# Patient Record
Sex: Female | Born: 1935 | Race: White | Hispanic: No | Smoking: Never smoker
Health system: Southern US, Community
[De-identification: ages and names within clinical notes are randomized; demographics above are authoritative.]

## PROBLEM LIST (undated history)

## (undated) DIAGNOSIS — I10 Essential (primary) hypertension: Secondary | ICD-10-CM

## (undated) DIAGNOSIS — E785 Hyperlipidemia, unspecified: Secondary | ICD-10-CM

## (undated) HISTORY — PX: CORONARY ANGIOPLASTY WITH STENT PLACEMENT: SHX49

---

## 2019-07-19 ENCOUNTER — Other Ambulatory Visit: Payer: Self-pay

## 2019-07-19 DIAGNOSIS — Z20822 Contact with and (suspected) exposure to covid-19: Secondary | ICD-10-CM

## 2019-07-20 LAB — NOVEL CORONAVIRUS, NAA: SARS-CoV-2, NAA: NOT DETECTED

## 2019-07-21 ENCOUNTER — Telehealth: Payer: Self-pay

## 2019-07-21 NOTE — Telephone Encounter (Signed)
Patient and daughter called in and received covid test results

## 2019-07-23 ENCOUNTER — Emergency Department
Admission: EM | Admit: 2019-07-23 | Discharge: 2019-07-23 | Disposition: A | Discharge status: Home / Self Care | Payer: Medicare (Managed Care) | Attending: Emergency Medicine | Admitting: Emergency Medicine

## 2019-07-23 ENCOUNTER — Other Ambulatory Visit: Payer: Self-pay

## 2019-07-23 DIAGNOSIS — Z79899 Other long term (current) drug therapy: Secondary | ICD-10-CM | POA: Diagnosis not present

## 2019-07-23 DIAGNOSIS — I10 Essential (primary) hypertension: Secondary | ICD-10-CM | POA: Diagnosis not present

## 2019-07-23 DIAGNOSIS — R21 Rash and other nonspecific skin eruption: Secondary | ICD-10-CM | POA: Insufficient documentation

## 2019-07-23 DIAGNOSIS — Z7901 Long term (current) use of anticoagulants: Secondary | ICD-10-CM | POA: Diagnosis not present

## 2019-07-23 HISTORY — DX: Essential (primary) hypertension: I10

## 2019-07-23 HISTORY — DX: Hyperlipidemia, unspecified: E78.5

## 2019-07-23 MED ORDER — CEPHALEXIN 500 MG PO CAPS: 500.0000 mg | ORAL_CAPSULE | Freq: Two times a day (BID) | ORAL | 0 refills | Status: DC

## 2019-07-23 NOTE — ED Triage Notes (Signed)
Pt is here visiting from [place] with her daughter who states she noticed this area on the left temple that has been there and seems to have some drainage from it . Denies injury.

## 2019-07-23 NOTE — ED Notes (Signed)
See triage note  Daughter states she noticed a possible abscess area left temporal area  Unsure how long it has been there    But pt was complaining of pain and some min drainage noted

## 2019-07-23 NOTE — Discharge Instructions (Signed)
Call  [facility] and Dr. Kirkland Hun both of these are dermatologist that are in different offices.  Keep the area clean and dry.  Keflex 500 mg twice a day for the next 5 days.  The area is suspicious and should be looked at by a dermatologist.

## 2019-07-23 NOTE — ED Provider Notes (Signed)
[facility] Provider Note   ____________________________________________   First MD Initiated Contact with Patient 07/23/19 1224     (approximate)  I have reviewed the triage vital signs and the nursing notes.   HISTORY  Chief Complaint Abscess History also by daughter.  HPI Sara Conley is a 85 y.o. female presents to the ED with complaint of possible abscess in the left temporal area.  Daughter states that patient recently came to [place] from [place].  Daughter is unsure how long this area has been at the left temporal area.  Patient was complaining of pain with minimal drainage.  There is been no history of injury.  Negative for fever.  Daughter states that patient will be staying with her in [place].      Past Medical History:  Diagnosis Date  . Hyperlipidemia   . Hypertension     There are no active problems to display for this patient.   Past Surgical History:  Procedure Laterality Date  . CORONARY ANGIOPLASTY WITH STENT PLACEMENT      Prior to Admission medications   Medication Sig Start Date End Date Taking? Authorizing Provider  atorvastatin (LIPITOR) 40 MG tablet Take 40 mg by mouth daily.   Yes [provider]  clopidogrel (PLAVIX) 75 MG tablet Take 75 mg by mouth daily.   Yes [provider]  doxepin (SINEQUAN) 10 MG capsule Take 10 mg by mouth.   Yes [provider]  irbesartan (AVAPRO) 300 MG tablet Take 300 mg by mouth daily.   Yes [provider]  cephALEXin (KEFLEX) 500 MG capsule Take 1 capsule (500 mg total) by mouth 2 (two) times daily. 07/23/19   Johnn Hai, PA-C    Allergies Patient has no known allergies.  No family history on file.  Social History Social History   Tobacco Use  . Smoking status: Never Smoker  . Smokeless tobacco: Never Used  Substance Use Topics  . Alcohol use: Yes    Frequency: Never  . Drug use: Never     Review of Systems Constitutional: No fever/chills Eyes: No visual changes. ENT: No complaints. Cardiovascular: Denies chest pain. Respiratory: Denies shortness of breath. Musculoskeletal: Negative for back pain. Skin: Positive for skin eruption Neurological: Negative for headaches, focal weakness or numbness. ____________________________________________   PHYSICAL EXAM:  VITAL SIGNS: ED Triage Vitals  Enc Vitals Group     BP 07/23/19 1151 122/77     Pulse Rate 07/23/19 1151 63     Resp 07/23/19 1151 16     Temp 07/23/19 1151 98.8 F (37.1 C)     Temp Source 07/23/19 1151 Oral     SpO2 07/23/19 1151 97 %     Weight 07/23/19 1209 103 lb (46.7 kg)     Height 07/23/19 1209 4\' 10"  (1.473 m)     Head Circumference --      Peak Flow --      Pain Score 07/23/19 1209 6     Pain Loc --      Pain Edu? --      Excl. in [place]? --    Constitutional: Alert and oriented. Well appearing and in no acute distress. Eyes: Conjunctivae are normal. PERRL. EOMI. Head: Atraumatic. Neck: No stridor.   Cardiovascular: Normal rate, regular rhythm. Grossly normal heart sounds.  Good peripheral circulation. Respiratory: Normal respiratory effort.  No retractions. Lungs CTAB. Musculoskeletal: No lower extremity tenderness nor edema.  No joint effusions. Neurologic:  Normal speech and language. No gross focal neurologic deficits are appreciated. No gait instability. Skin:  Skin is warm, dry.  Left temporal area there is a single lesion that is discolored and raised.  The edges of this area has a pearly-like sheen and border is discrete.  The upper part of this lesion is hard and no drainage is noted.  This appears to have been there longer been daughter initially said. Psychiatric: Mood and affect are normal. Speech and behavior are normal.  ____________________________________________   LABS (all labs ordered are listed, but only abnormal results are displayed)  Labs Reviewed - No data to display   PROCEDURES  Procedure(s) performed (including Critical Care):  Procedures   ____________________________________________   INITIAL IMPRESSION / ASSESSMENT AND PLAN / ED COURSE  As part of my medical decision making, I reviewed the following data within the electronic MEDICAL RECORD NUMBER Notes from prior ED visits and [place] Controlled Substance Database  85 year old female is brought to the ED by daughter with an area to the left temporal that is uncertain how long it is been there.  On exam there is 2 areas of concern and a basal cell carcinoma needs to be ruled out.  Daughter was made aware that she would need to see a dermatologist and most likely have this biopsied to make a determination what type of lesion this is.  She was given the phone numbers and information about the 2 dermatologist listed in [place].  A prescription for Keflex 500 mg 1 twice a day was given to the patient as daughter feels that this area is infected. ____________________________________________   FINAL CLINICAL IMPRESSION(S) / ED DIAGNOSES  Final diagnoses:  Skin eruption     ED Discharge Orders         Ordered    cephALEXin (KEFLEX) 500 MG capsule  2 times daily     07/23/19 1317           Note:  This document was prepared using Dragon voice recognition software and may include unintentional dictation errors.    Tommi RumpsSummers, Rhonda L, PA-C 07/23/19 1533    Sharman CheekStafford, Phillip, MD 07/25/19 [phone]

## 2019-11-08 DIAGNOSIS — M79674 Pain in right toe(s): Secondary | ICD-10-CM | POA: Diagnosis not present

## 2019-11-08 DIAGNOSIS — H02036 Senile entropion of left eye, unspecified eyelid: Secondary | ICD-10-CM | POA: Diagnosis not present

## 2019-11-08 DIAGNOSIS — M79675 Pain in left toe(s): Secondary | ICD-10-CM | POA: Diagnosis not present

## 2019-11-08 DIAGNOSIS — B351 Tinea unguium: Secondary | ICD-10-CM | POA: Diagnosis not present

## 2019-11-08 DIAGNOSIS — M205X1 Other deformities of toe(s) (acquired), right foot: Secondary | ICD-10-CM | POA: Diagnosis not present

## 2019-11-08 DIAGNOSIS — M2042 Other hammer toe(s) (acquired), left foot: Secondary | ICD-10-CM | POA: Diagnosis not present

## 2019-11-11 DIAGNOSIS — I10 Essential (primary) hypertension: Secondary | ICD-10-CM | POA: Diagnosis not present

## 2019-11-11 DIAGNOSIS — G4709 Other insomnia: Secondary | ICD-10-CM | POA: Diagnosis present

## 2019-11-11 DIAGNOSIS — I2510 Atherosclerotic heart disease of native coronary artery without angina pectoris: Secondary | ICD-10-CM | POA: Diagnosis not present

## 2019-11-11 DIAGNOSIS — F5101 Primary insomnia: Secondary | ICD-10-CM | POA: Diagnosis not present

## 2019-11-11 DIAGNOSIS — E7800 Pure hypercholesterolemia, unspecified: Secondary | ICD-10-CM | POA: Diagnosis present

## 2019-11-12 DIAGNOSIS — I10 Essential (primary) hypertension: Secondary | ICD-10-CM | POA: Diagnosis not present

## 2019-11-12 DIAGNOSIS — E7800 Pure hypercholesterolemia, unspecified: Secondary | ICD-10-CM | POA: Diagnosis not present

## 2019-11-15 DIAGNOSIS — H02036 Senile entropion of left eye, unspecified eyelid: Secondary | ICD-10-CM | POA: Diagnosis not present

## 2019-11-22 DIAGNOSIS — I2510 Atherosclerotic heart disease of native coronary artery without angina pectoris: Secondary | ICD-10-CM | POA: Diagnosis not present

## 2019-11-22 DIAGNOSIS — E7800 Pure hypercholesterolemia, unspecified: Secondary | ICD-10-CM | POA: Diagnosis not present

## 2019-11-22 DIAGNOSIS — I38 Endocarditis, valve unspecified: Secondary | ICD-10-CM | POA: Diagnosis present

## 2019-11-22 DIAGNOSIS — I10 Essential (primary) hypertension: Secondary | ICD-10-CM | POA: Diagnosis not present

## 2019-11-25 DIAGNOSIS — H02035 Senile entropion of left lower eyelid: Secondary | ICD-10-CM | POA: Diagnosis not present

## 2019-11-25 DIAGNOSIS — H02036 Senile entropion of left eye, unspecified eyelid: Secondary | ICD-10-CM | POA: Diagnosis not present

## 2019-12-03 DIAGNOSIS — R208 Other disturbances of skin sensation: Secondary | ICD-10-CM | POA: Diagnosis not present

## 2019-12-03 DIAGNOSIS — H61031 Chondritis of right external ear: Secondary | ICD-10-CM | POA: Diagnosis not present

## 2019-12-09 DIAGNOSIS — R350 Frequency of micturition: Secondary | ICD-10-CM | POA: Diagnosis not present

## 2020-02-07 DIAGNOSIS — D0422 Carcinoma in situ of skin of left ear and external auricular canal: Secondary | ICD-10-CM | POA: Diagnosis not present

## 2020-02-07 DIAGNOSIS — L570 Actinic keratosis: Secondary | ICD-10-CM | POA: Diagnosis not present

## 2020-02-07 DIAGNOSIS — L905 Scar conditions and fibrosis of skin: Secondary | ICD-10-CM | POA: Diagnosis not present

## 2020-02-09 DIAGNOSIS — M2042 Other hammer toe(s) (acquired), left foot: Secondary | ICD-10-CM | POA: Diagnosis not present

## 2020-02-09 DIAGNOSIS — B351 Tinea unguium: Secondary | ICD-10-CM | POA: Diagnosis not present

## 2020-02-09 DIAGNOSIS — M205X1 Other deformities of toe(s) (acquired), right foot: Secondary | ICD-10-CM | POA: Diagnosis not present

## 2020-02-09 DIAGNOSIS — M79675 Pain in left toe(s): Secondary | ICD-10-CM | POA: Diagnosis not present

## 2020-02-09 DIAGNOSIS — M79674 Pain in right toe(s): Secondary | ICD-10-CM | POA: Diagnosis not present

## 2020-02-16 DIAGNOSIS — H353131 Nonexudative age-related macular degeneration, bilateral, early dry stage: Secondary | ICD-10-CM | POA: Diagnosis not present

## 2020-02-25 ENCOUNTER — Other Ambulatory Visit: Payer: Self-pay

## 2020-02-25 ENCOUNTER — Emergency Department: Payer: PPO

## 2020-02-25 ENCOUNTER — Emergency Department
Admission: EM | Admit: 2020-02-25 | Discharge: 2020-02-25 | Disposition: A | Discharge status: Home / Self Care | Payer: PPO | Attending: Emergency Medicine | Admitting: Emergency Medicine

## 2020-02-25 DIAGNOSIS — I10 Essential (primary) hypertension: Secondary | ICD-10-CM | POA: Diagnosis not present

## 2020-02-25 DIAGNOSIS — Z79899 Other long term (current) drug therapy: Secondary | ICD-10-CM | POA: Insufficient documentation

## 2020-02-25 DIAGNOSIS — Z7901 Long term (current) use of anticoagulants: Secondary | ICD-10-CM | POA: Diagnosis not present

## 2020-02-25 DIAGNOSIS — Y999 Unspecified external cause status: Secondary | ICD-10-CM | POA: Diagnosis not present

## 2020-02-25 DIAGNOSIS — Y9389 Activity, other specified: Secondary | ICD-10-CM | POA: Insufficient documentation

## 2020-02-25 DIAGNOSIS — W19XXXA Unspecified fall, initial encounter: Secondary | ICD-10-CM

## 2020-02-25 DIAGNOSIS — S0101XA Laceration without foreign body of scalp, initial encounter: Secondary | ICD-10-CM | POA: Diagnosis not present

## 2020-02-25 DIAGNOSIS — W07XXXA Fall from chair, initial encounter: Secondary | ICD-10-CM | POA: Insufficient documentation

## 2020-02-25 DIAGNOSIS — Y92002 Bathroom of unspecified non-institutional (private) residence single-family (private) house as the place of occurrence of the external cause: Secondary | ICD-10-CM | POA: Insufficient documentation

## 2020-02-25 MED ORDER — BACITRACIN-NEOMYCIN-POLYMYXIN 400-5-5000 EX OINT
TOPICAL_OINTMENT | Freq: Once | CUTANEOUS | Status: DC
  Filled 2020-02-25: qty 1

## 2020-02-25 MED ORDER — LIDOCAINE-EPINEPHRINE 2 %-1:100000 IJ SOLN
20.0000 mL | Freq: Once | INTRAMUSCULAR | Status: DC
  Filled 2020-02-25: qty 1

## 2020-02-25 NOTE — ED Provider Notes (Signed)
[facility] Emergency Department Provider Note   ____________________________________________   First MD Initiated Contact with Patient 02/25/20 1139     (approximate)  I have reviewed the triage vital signs and the nursing notes.   HISTORY  Chief Complaint Fall    HPI Sara Conley is a 86 y.o. female with possible history of hypertension and hyperlipidemia who presents to the ED complaining of fall.  History is limited due to language barrier, patient speaks in a specific [place] dialect.  Per daughter at bedside, patient was sitting on a chair in the bathroom when she fell forward and struck her head.  Daughter does not think that she lost consciousness as family heard her immediately call out.  She did suffer a laceration to her frontal scalp and is complaining of pain to this area, but otherwise denies any headache, neck pain, or extremity pain.  She does currently take Plavix.        Past Medical History:  Diagnosis Date  . Hyperlipidemia   . Hypertension     There are no problems to display for this patient.   Past Surgical History:  Procedure Laterality Date  . CORONARY ANGIOPLASTY WITH STENT PLACEMENT      Prior to Admission medications   Medication Sig Start Date End Date Taking? Authorizing Provider  atorvastatin (LIPITOR) 40 MG tablet Take 40 mg by mouth at bedtime.    Yes [provider]  clopidogrel (PLAVIX) 75 MG tablet Take 75 mg by mouth daily.   Yes [provider]  Doxepin HCl 6 MG TABS Take 6 mg by mouth at bedtime.   Yes [provider]  irbesartan (AVAPRO) 300 MG tablet Take 300 mg by mouth daily.   Yes [provider]  metoprolol succinate (TOPROL-XL) 50 MG 24 hr tablet Take 50 mg by mouth daily. 02/24/20  Yes [provider]    Allergies Patient has no known allergies.  No family history on file.  Social History Social History   Tobacco Use  . Smoking status: Never  Smoker  . Smokeless tobacco: Never Used  Substance Use Topics  . Alcohol use: Yes  . Drug use: Never    Review of Systems  Constitutional: No fever/chills Eyes: No visual changes. ENT: No sore throat. Cardiovascular: Denies chest pain. Respiratory: Denies shortness of breath. Gastrointestinal: No abdominal pain.  No nausea, no vomiting.  No diarrhea.  No constipation. Genitourinary: Negative for dysuria. Musculoskeletal: Negative for back pain. Skin: Negative for rash.  Positive for scalp laceration. Neurological: Negative for headaches, focal weakness or numbness.  ____________________________________________   PHYSICAL EXAM:  VITAL SIGNS: ED Triage Vitals  Enc Vitals Group     BP 02/25/20 0917 (!) 156/48     Pulse Rate 02/25/20 0917 74     Resp 02/25/20 0917 13     Temp 02/25/20 0917 97.8 F (36.6 C)     Temp Source 02/25/20 0917 Oral     SpO2 --      Weight 02/25/20 0919 104 lb (47.2 kg)     Height 02/25/20 0919 4\' 11"  (1.499 m)     Head Circumference --      Peak Flow --      Pain Score 02/25/20 0918 9     Pain Loc --      Pain Edu? --      Excl. in [place]? --     Constitutional: Alert and oriented. Eyes: Conjunctivae are normal. Head: Approximately 4 cm frontal  scalp laceration with minimal active bleeding. Nose: No congestion/rhinnorhea. Mouth/Throat: Mucous membranes are moist. Neck: Normal ROM, no midline cervical spine tenderness. Cardiovascular: Normal rate, regular rhythm. Grossly normal heart sounds. Respiratory: Normal respiratory effort.  No retractions. Lungs CTAB. Gastrointestinal: Soft and nontender. No distention. Genitourinary: deferred Musculoskeletal: No lower extremity tenderness nor edema. Neurologic:  Normal speech and language. No gross focal neurologic deficits are appreciated. Skin:  Skin is warm, dry and intact. No rash noted. Psychiatric: Mood and affect are normal. Speech and behavior are  normal.  ____________________________________________   LABS (all labs ordered are listed, but only abnormal results are displayed)  Labs Reviewed - No data to display   PROCEDURES  Procedure(s) performed (including Critical Care):  Marland KitchenMarland KitchenLaceration Repair  Date/Time: 02/25/2020 3:46 PM Performed by: Blake Divine, MD Authorized by: Blake Divine, MD   Consent:    Consent obtained:  Verbal   Consent given by:  Healthcare agent   Risks discussed:  Infection, pain, retained foreign body, poor cosmetic result and poor wound healing   Alternatives discussed:  No treatment Anesthesia (see MAR for exact dosages):    Anesthesia method:  Local infiltration   Local anesthetic:  Lidocaine 2% WITH epi Laceration details:    Location:  Scalp   Scalp location:  Frontal   Length (cm):  4 Repair type:    Repair type:  Simple Pre-procedure details:    Preparation:  Patient was prepped and draped in usual sterile fashion and imaging obtained to evaluate for foreign bodies Exploration:    Contaminated: no   Treatment:    Area cleansed with:  Saline   Amount of cleaning:  Standard   Irrigation solution:  Sterile saline   Irrigation method:  Pressure wash Skin repair:    Repair method:  Sutures   Suture size:  4-0   Suture material:  Nylon   Suture technique:  Simple interrupted   Number of sutures:  5 Approximation:    Approximation:  Loose Post-procedure details:    Dressing:  Antibiotic ointment and tube gauze   Patient tolerance of procedure:  Tolerated well, no immediate complications     ____________________________________________   INITIAL IMPRESSION / ASSESSMENT AND PLAN / ED COURSE       86 year old female presents to the ED following mechanical fall onto her head with no LOC, but with laceration to frontal scalp.  She is at her baseline mental status with no focal neurologic deficits, CT head is negative for acute process.  Per family, her tetanus is up-to-date.   Laceration to frontal scalp was repaired without difficulty, bacitracin and dressing were applied.  She is appropriate for discharge home and family advised to have stitches removed in 1 week, family agrees with plan.      ____________________________________________   FINAL CLINICAL IMPRESSION(S) / ED DIAGNOSES  Final diagnoses:  Fall, initial encounter  Laceration of scalp, initial encounter     ED Discharge Orders    None       Note:  This document was prepared using Dragon voice recognition software and may include unintentional dictation errors.   Blake Divine, MD 02/25/20 [phone]

## 2020-02-25 NOTE — ED Triage Notes (Signed)
Pt states she was in the bathroom changing her pad and tipped over hitting her fore head. Pt has a lac with controlled bleeding. Denies LOC. Pt is a/o at present. Pt does take blood thinners.

## 2020-03-03 DIAGNOSIS — Z4802 Encounter for removal of sutures: Secondary | ICD-10-CM | POA: Diagnosis not present

## 2020-03-03 DIAGNOSIS — W07XXXA Fall from chair, initial encounter: Secondary | ICD-10-CM | POA: Diagnosis not present

## 2020-03-03 DIAGNOSIS — S0181XA Laceration without foreign body of other part of head, initial encounter: Secondary | ICD-10-CM | POA: Diagnosis not present

## 2020-03-30 DIAGNOSIS — H903 Sensorineural hearing loss, bilateral: Secondary | ICD-10-CM | POA: Diagnosis not present

## 2020-03-30 DIAGNOSIS — J300 Vasomotor rhinitis: Secondary | ICD-10-CM | POA: Diagnosis not present

## 2020-03-30 DIAGNOSIS — H6123 Impacted cerumen, bilateral: Secondary | ICD-10-CM | POA: Diagnosis not present

## 2020-04-21 DIAGNOSIS — L308 Other specified dermatitis: Secondary | ICD-10-CM | POA: Diagnosis not present

## 2020-05-09 DIAGNOSIS — I10 Essential (primary) hypertension: Secondary | ICD-10-CM | POA: Diagnosis not present

## 2020-05-09 DIAGNOSIS — E7800 Pure hypercholesterolemia, unspecified: Secondary | ICD-10-CM | POA: Diagnosis not present

## 2020-05-09 DIAGNOSIS — I38 Endocarditis, valve unspecified: Secondary | ICD-10-CM | POA: Diagnosis not present

## 2020-05-09 DIAGNOSIS — I2510 Atherosclerotic heart disease of native coronary artery without angina pectoris: Secondary | ICD-10-CM | POA: Diagnosis not present

## 2020-05-25 DIAGNOSIS — M79674 Pain in right toe(s): Secondary | ICD-10-CM | POA: Diagnosis not present

## 2020-05-25 DIAGNOSIS — B351 Tinea unguium: Secondary | ICD-10-CM | POA: Diagnosis not present

## 2020-05-25 DIAGNOSIS — L97521 Non-pressure chronic ulcer of other part of left foot limited to breakdown of skin: Secondary | ICD-10-CM | POA: Diagnosis not present

## 2020-05-25 DIAGNOSIS — M79675 Pain in left toe(s): Secondary | ICD-10-CM | POA: Diagnosis not present

## 2020-05-25 DIAGNOSIS — M2042 Other hammer toe(s) (acquired), left foot: Secondary | ICD-10-CM | POA: Diagnosis not present

## 2020-05-31 DIAGNOSIS — F5101 Primary insomnia: Secondary | ICD-10-CM | POA: Diagnosis not present

## 2020-05-31 DIAGNOSIS — R350 Frequency of micturition: Secondary | ICD-10-CM | POA: Diagnosis not present

## 2020-06-08 DIAGNOSIS — H02036 Senile entropion of left eye, unspecified eyelid: Secondary | ICD-10-CM | POA: Diagnosis not present

## 2020-06-15 DIAGNOSIS — N3941 Urge incontinence: Secondary | ICD-10-CM | POA: Diagnosis not present

## 2020-06-15 DIAGNOSIS — R5381 Other malaise: Secondary | ICD-10-CM | POA: Diagnosis not present

## 2020-06-15 DIAGNOSIS — R531 Weakness: Secondary | ICD-10-CM | POA: Diagnosis not present

## 2020-06-15 DIAGNOSIS — G4709 Other insomnia: Secondary | ICD-10-CM | POA: Diagnosis not present

## 2020-06-26 DIAGNOSIS — N3941 Urge incontinence: Secondary | ICD-10-CM | POA: Diagnosis not present

## 2020-06-26 DIAGNOSIS — Z955 Presence of coronary angioplasty implant and graft: Secondary | ICD-10-CM | POA: Diagnosis not present

## 2020-06-26 DIAGNOSIS — R4181 Age-related cognitive decline: Secondary | ICD-10-CM | POA: Diagnosis not present

## 2020-06-26 DIAGNOSIS — D649 Anemia, unspecified: Secondary | ICD-10-CM | POA: Diagnosis not present

## 2020-06-26 DIAGNOSIS — G4709 Other insomnia: Secondary | ICD-10-CM | POA: Diagnosis not present

## 2020-06-26 DIAGNOSIS — Z9849 Cataract extraction status, unspecified eye: Secondary | ICD-10-CM | POA: Diagnosis not present

## 2020-06-26 DIAGNOSIS — E7800 Pure hypercholesterolemia, unspecified: Secondary | ICD-10-CM | POA: Diagnosis not present

## 2020-06-26 DIAGNOSIS — I2510 Atherosclerotic heart disease of native coronary artery without angina pectoris: Secondary | ICD-10-CM | POA: Diagnosis not present

## 2020-06-26 DIAGNOSIS — I38 Endocarditis, valve unspecified: Secondary | ICD-10-CM | POA: Diagnosis not present

## 2020-06-26 DIAGNOSIS — Z7901 Long term (current) use of anticoagulants: Secondary | ICD-10-CM | POA: Diagnosis not present

## 2020-06-26 DIAGNOSIS — M6281 Muscle weakness (generalized): Secondary | ICD-10-CM | POA: Diagnosis not present

## 2020-06-26 DIAGNOSIS — Z96649 Presence of unspecified artificial hip joint: Secondary | ICD-10-CM | POA: Diagnosis not present

## 2020-06-26 DIAGNOSIS — I10 Essential (primary) hypertension: Secondary | ICD-10-CM | POA: Diagnosis not present

## 2020-06-26 DIAGNOSIS — Z9181 History of falling: Secondary | ICD-10-CM | POA: Diagnosis not present

## 2020-06-30 DIAGNOSIS — H02036 Senile entropion of left eye, unspecified eyelid: Secondary | ICD-10-CM | POA: Diagnosis not present

## 2020-07-04 DIAGNOSIS — Z9849 Cataract extraction status, unspecified eye: Secondary | ICD-10-CM | POA: Diagnosis not present

## 2020-07-04 DIAGNOSIS — E7800 Pure hypercholesterolemia, unspecified: Secondary | ICD-10-CM | POA: Diagnosis not present

## 2020-07-04 DIAGNOSIS — R4181 Age-related cognitive decline: Secondary | ICD-10-CM | POA: Diagnosis not present

## 2020-07-04 DIAGNOSIS — Z96649 Presence of unspecified artificial hip joint: Secondary | ICD-10-CM | POA: Diagnosis not present

## 2020-07-04 DIAGNOSIS — Z9181 History of falling: Secondary | ICD-10-CM | POA: Diagnosis not present

## 2020-07-04 DIAGNOSIS — G4709 Other insomnia: Secondary | ICD-10-CM | POA: Diagnosis not present

## 2020-07-04 DIAGNOSIS — I10 Essential (primary) hypertension: Secondary | ICD-10-CM | POA: Diagnosis not present

## 2020-07-04 DIAGNOSIS — I2510 Atherosclerotic heart disease of native coronary artery without angina pectoris: Secondary | ICD-10-CM | POA: Diagnosis not present

## 2020-07-04 DIAGNOSIS — Z955 Presence of coronary angioplasty implant and graft: Secondary | ICD-10-CM | POA: Diagnosis not present

## 2020-07-04 DIAGNOSIS — D649 Anemia, unspecified: Secondary | ICD-10-CM | POA: Diagnosis not present

## 2020-07-04 DIAGNOSIS — M6281 Muscle weakness (generalized): Secondary | ICD-10-CM | POA: Diagnosis not present

## 2020-07-04 DIAGNOSIS — Z7901 Long term (current) use of anticoagulants: Secondary | ICD-10-CM | POA: Diagnosis not present

## 2020-07-04 DIAGNOSIS — N3941 Urge incontinence: Secondary | ICD-10-CM | POA: Diagnosis not present

## 2020-07-04 DIAGNOSIS — I38 Endocarditis, valve unspecified: Secondary | ICD-10-CM | POA: Diagnosis not present

## 2020-07-05 DIAGNOSIS — R682 Dry mouth, unspecified: Secondary | ICD-10-CM | POA: Diagnosis not present

## 2020-07-05 DIAGNOSIS — R1314 Dysphagia, pharyngoesophageal phase: Secondary | ICD-10-CM | POA: Diagnosis not present

## 2020-07-05 DIAGNOSIS — F458 Other somatoform disorders: Secondary | ICD-10-CM | POA: Diagnosis not present

## 2020-07-06 DIAGNOSIS — Z96649 Presence of unspecified artificial hip joint: Secondary | ICD-10-CM | POA: Diagnosis not present

## 2020-07-06 DIAGNOSIS — D649 Anemia, unspecified: Secondary | ICD-10-CM | POA: Diagnosis not present

## 2020-07-06 DIAGNOSIS — E7800 Pure hypercholesterolemia, unspecified: Secondary | ICD-10-CM | POA: Diagnosis not present

## 2020-07-06 DIAGNOSIS — I2510 Atherosclerotic heart disease of native coronary artery without angina pectoris: Secondary | ICD-10-CM | POA: Diagnosis not present

## 2020-07-06 DIAGNOSIS — Z9181 History of falling: Secondary | ICD-10-CM | POA: Diagnosis not present

## 2020-07-06 DIAGNOSIS — Z955 Presence of coronary angioplasty implant and graft: Secondary | ICD-10-CM | POA: Diagnosis not present

## 2020-07-06 DIAGNOSIS — I38 Endocarditis, valve unspecified: Secondary | ICD-10-CM | POA: Diagnosis not present

## 2020-07-06 DIAGNOSIS — I10 Essential (primary) hypertension: Secondary | ICD-10-CM | POA: Diagnosis not present

## 2020-07-06 DIAGNOSIS — Z9849 Cataract extraction status, unspecified eye: Secondary | ICD-10-CM | POA: Diagnosis not present

## 2020-07-06 DIAGNOSIS — M6281 Muscle weakness (generalized): Secondary | ICD-10-CM | POA: Diagnosis not present

## 2020-07-06 DIAGNOSIS — N3941 Urge incontinence: Secondary | ICD-10-CM | POA: Diagnosis not present

## 2020-07-06 DIAGNOSIS — Z7901 Long term (current) use of anticoagulants: Secondary | ICD-10-CM | POA: Diagnosis not present

## 2020-07-06 DIAGNOSIS — G4709 Other insomnia: Secondary | ICD-10-CM | POA: Diagnosis not present

## 2020-07-06 DIAGNOSIS — R4181 Age-related cognitive decline: Secondary | ICD-10-CM | POA: Diagnosis not present

## 2020-07-07 DIAGNOSIS — I2510 Atherosclerotic heart disease of native coronary artery without angina pectoris: Secondary | ICD-10-CM | POA: Diagnosis not present

## 2020-07-07 DIAGNOSIS — M6281 Muscle weakness (generalized): Secondary | ICD-10-CM | POA: Diagnosis not present

## 2020-07-07 DIAGNOSIS — I38 Endocarditis, valve unspecified: Secondary | ICD-10-CM | POA: Diagnosis not present

## 2020-07-07 DIAGNOSIS — N3941 Urge incontinence: Secondary | ICD-10-CM | POA: Diagnosis not present

## 2020-07-07 DIAGNOSIS — G4709 Other insomnia: Secondary | ICD-10-CM | POA: Diagnosis not present

## 2020-07-07 DIAGNOSIS — R4181 Age-related cognitive decline: Secondary | ICD-10-CM | POA: Diagnosis not present

## 2020-07-07 DIAGNOSIS — I10 Essential (primary) hypertension: Secondary | ICD-10-CM | POA: Diagnosis not present

## 2020-07-12 DIAGNOSIS — R4181 Age-related cognitive decline: Secondary | ICD-10-CM | POA: Diagnosis not present

## 2020-07-12 DIAGNOSIS — Z96649 Presence of unspecified artificial hip joint: Secondary | ICD-10-CM | POA: Diagnosis not present

## 2020-07-12 DIAGNOSIS — Z7901 Long term (current) use of anticoagulants: Secondary | ICD-10-CM | POA: Diagnosis not present

## 2020-07-12 DIAGNOSIS — I10 Essential (primary) hypertension: Secondary | ICD-10-CM | POA: Diagnosis not present

## 2020-07-12 DIAGNOSIS — I38 Endocarditis, valve unspecified: Secondary | ICD-10-CM | POA: Diagnosis not present

## 2020-07-12 DIAGNOSIS — E7800 Pure hypercholesterolemia, unspecified: Secondary | ICD-10-CM | POA: Diagnosis not present

## 2020-07-12 DIAGNOSIS — M6281 Muscle weakness (generalized): Secondary | ICD-10-CM | POA: Diagnosis not present

## 2020-07-12 DIAGNOSIS — Z9849 Cataract extraction status, unspecified eye: Secondary | ICD-10-CM | POA: Diagnosis not present

## 2020-07-12 DIAGNOSIS — N3941 Urge incontinence: Secondary | ICD-10-CM | POA: Diagnosis not present

## 2020-07-12 DIAGNOSIS — I2510 Atherosclerotic heart disease of native coronary artery without angina pectoris: Secondary | ICD-10-CM | POA: Diagnosis not present

## 2020-07-12 DIAGNOSIS — Z955 Presence of coronary angioplasty implant and graft: Secondary | ICD-10-CM | POA: Diagnosis not present

## 2020-07-12 DIAGNOSIS — Z9181 History of falling: Secondary | ICD-10-CM | POA: Diagnosis not present

## 2020-07-12 DIAGNOSIS — G4709 Other insomnia: Secondary | ICD-10-CM | POA: Diagnosis not present

## 2020-07-12 DIAGNOSIS — D649 Anemia, unspecified: Secondary | ICD-10-CM | POA: Diagnosis not present

## 2020-07-13 DIAGNOSIS — N3941 Urge incontinence: Secondary | ICD-10-CM | POA: Diagnosis not present

## 2020-07-13 DIAGNOSIS — I38 Endocarditis, valve unspecified: Secondary | ICD-10-CM | POA: Diagnosis not present

## 2020-07-13 DIAGNOSIS — G4709 Other insomnia: Secondary | ICD-10-CM | POA: Diagnosis not present

## 2020-07-13 DIAGNOSIS — I2510 Atherosclerotic heart disease of native coronary artery without angina pectoris: Secondary | ICD-10-CM | POA: Diagnosis not present

## 2020-07-13 DIAGNOSIS — Z7901 Long term (current) use of anticoagulants: Secondary | ICD-10-CM | POA: Diagnosis not present

## 2020-07-13 DIAGNOSIS — E7800 Pure hypercholesterolemia, unspecified: Secondary | ICD-10-CM | POA: Diagnosis not present

## 2020-07-13 DIAGNOSIS — M6281 Muscle weakness (generalized): Secondary | ICD-10-CM | POA: Diagnosis not present

## 2020-07-13 DIAGNOSIS — Z9181 History of falling: Secondary | ICD-10-CM | POA: Diagnosis not present

## 2020-07-13 DIAGNOSIS — Z9849 Cataract extraction status, unspecified eye: Secondary | ICD-10-CM | POA: Diagnosis not present

## 2020-07-13 DIAGNOSIS — Z96649 Presence of unspecified artificial hip joint: Secondary | ICD-10-CM | POA: Diagnosis not present

## 2020-07-13 DIAGNOSIS — I10 Essential (primary) hypertension: Secondary | ICD-10-CM | POA: Diagnosis not present

## 2020-07-13 DIAGNOSIS — Z955 Presence of coronary angioplasty implant and graft: Secondary | ICD-10-CM | POA: Diagnosis not present

## 2020-07-13 DIAGNOSIS — D649 Anemia, unspecified: Secondary | ICD-10-CM | POA: Diagnosis not present

## 2020-07-13 DIAGNOSIS — R4181 Age-related cognitive decline: Secondary | ICD-10-CM | POA: Diagnosis not present

## 2020-08-17 DIAGNOSIS — F419 Anxiety disorder, unspecified: Secondary | ICD-10-CM | POA: Diagnosis present

## 2020-08-17 DIAGNOSIS — F32A Depression, unspecified: Secondary | ICD-10-CM | POA: Diagnosis not present

## 2020-09-07 DIAGNOSIS — M79675 Pain in left toe(s): Secondary | ICD-10-CM | POA: Diagnosis not present

## 2020-09-07 DIAGNOSIS — M79674 Pain in right toe(s): Secondary | ICD-10-CM | POA: Diagnosis not present

## 2020-09-07 DIAGNOSIS — B351 Tinea unguium: Secondary | ICD-10-CM | POA: Diagnosis not present

## 2020-10-02 DIAGNOSIS — F419 Anxiety disorder, unspecified: Secondary | ICD-10-CM | POA: Diagnosis not present

## 2020-10-02 DIAGNOSIS — F32A Depression, unspecified: Secondary | ICD-10-CM | POA: Diagnosis not present

## 2020-10-04 DIAGNOSIS — R682 Dry mouth, unspecified: Secondary | ICD-10-CM | POA: Diagnosis not present

## 2020-10-04 DIAGNOSIS — R1314 Dysphagia, pharyngoesophageal phase: Secondary | ICD-10-CM | POA: Diagnosis not present

## 2020-10-09 ENCOUNTER — Other Ambulatory Visit: Payer: Self-pay | Admitting: Otolaryngology

## 2020-10-09 DIAGNOSIS — R1310 Dysphagia, unspecified: Secondary | ICD-10-CM

## 2020-10-09 DIAGNOSIS — Y844 Aspiration of fluid as the cause of abnormal reaction of the patient, or of later complication, without mention of misadventure at the time of the procedure: Secondary | ICD-10-CM

## 2020-10-20 ENCOUNTER — Ambulatory Visit
Admission: RE | Admit: 2020-10-20 | Discharge: 2020-10-20 | Disposition: A | Discharge status: Home / Self Care | Payer: PPO | Source: Ambulatory Visit | Attending: Otolaryngology | Admitting: Otolaryngology

## 2020-10-20 ENCOUNTER — Other Ambulatory Visit: Payer: Self-pay

## 2020-10-20 DIAGNOSIS — Y844 Aspiration of fluid as the cause of abnormal reaction of the patient, or of later complication, without mention of misadventure at the time of the procedure: Secondary | ICD-10-CM | POA: Diagnosis not present

## 2020-10-20 DIAGNOSIS — R059 Cough, unspecified: Secondary | ICD-10-CM | POA: Diagnosis not present

## 2020-10-20 DIAGNOSIS — R1310 Dysphagia, unspecified: Secondary | ICD-10-CM | POA: Diagnosis not present

## 2020-10-20 NOTE — Progress Notes (Signed)
Modified Barium Swallow Progress Note  Patient Details  Name: Sara Conley MRN: [id number] Date of Birth: 1918-09-11  Today's Date: 10/20/2020  Modified Barium Swallow completed.  Full report located under Chart Review in the Imaging Section.  Brief recommendations include the following:  Clinical Impression  Pt presents with grossly adequate oropharyngeal abilities when consuming thin liquids via cup, straw, nectar thick liquids via cup, puree, regular food textures and barium tablet whole with thin liquids. When viewing the imaging, pt's oral phase is mildly dis coordinated and her swallow initiation is at the level of vallecula and is occasionally at the level of the pyriform sinuses and she does have residue within her vallecula. However little research has been completed on how a person's oropharyngeal abilities age as they approach 86 years old. Pt didn't have any instances of coughing and no aspiration or penetration were observed in study. Education provided to pt's daughter with all questions answered to her satisfaction.   Swallow Evaluation Recommendations       SLP Diet Recommendations: Regular solids;Thin liquid   Liquid Administration via: Cup;Straw   Medication Administration: Whole meds with liquid   Supervision: Patient able to self feed   Compensations: Minimize environmental distractions;Slow rate;Small sips/bites   Postural Changes: Seated upright at 90 degrees   Oral Care Recommendations: Oral care BID      Rossanna Spitzley B. Dreama Saa M.S., CCC-SLP, [facility] Speech-Language Pathologist Rehabilitation Services Office [phone]   Shawntee Mainwaring 10/20/2020,4:40 PM

## 2020-10-25 ENCOUNTER — Ambulatory Visit: Payer: PPO

## 2020-12-11 ENCOUNTER — Emergency Department: Payer: PPO

## 2020-12-11 ENCOUNTER — Other Ambulatory Visit: Payer: Self-pay

## 2020-12-11 ENCOUNTER — Emergency Department
Admission: EM | Admit: 2020-12-11 | Discharge: 2020-12-11 | Disposition: A | Discharge status: Home / Self Care | Payer: PPO | Attending: Emergency Medicine | Admitting: Emergency Medicine

## 2020-12-11 DIAGNOSIS — S42021A Displaced fracture of shaft of right clavicle, initial encounter for closed fracture: Secondary | ICD-10-CM | POA: Diagnosis not present

## 2020-12-11 DIAGNOSIS — Z79899 Other long term (current) drug therapy: Secondary | ICD-10-CM | POA: Diagnosis not present

## 2020-12-11 DIAGNOSIS — W1830XA Fall on same level, unspecified, initial encounter: Secondary | ICD-10-CM | POA: Insufficient documentation

## 2020-12-11 DIAGNOSIS — S42001A Fracture of unspecified part of right clavicle, initial encounter for closed fracture: Secondary | ICD-10-CM | POA: Diagnosis not present

## 2020-12-11 DIAGNOSIS — I10 Essential (primary) hypertension: Secondary | ICD-10-CM | POA: Diagnosis not present

## 2020-12-11 DIAGNOSIS — M47812 Spondylosis without myelopathy or radiculopathy, cervical region: Secondary | ICD-10-CM | POA: Diagnosis not present

## 2020-12-11 DIAGNOSIS — S42101A Fracture of unspecified part of scapula, right shoulder, initial encounter for closed fracture: Secondary | ICD-10-CM | POA: Diagnosis not present

## 2020-12-11 DIAGNOSIS — M25511 Pain in right shoulder: Secondary | ICD-10-CM | POA: Diagnosis not present

## 2020-12-11 DIAGNOSIS — M19011 Primary osteoarthritis, right shoulder: Secondary | ICD-10-CM | POA: Diagnosis not present

## 2020-12-11 DIAGNOSIS — R519 Headache, unspecified: Secondary | ICD-10-CM | POA: Diagnosis not present

## 2020-12-11 DIAGNOSIS — W01198A Fall on same level from slipping, tripping and stumbling with subsequent striking against other object, initial encounter: Secondary | ICD-10-CM | POA: Diagnosis not present

## 2020-12-11 DIAGNOSIS — S42031A Displaced fracture of lateral end of right clavicle, initial encounter for closed fracture: Secondary | ICD-10-CM | POA: Diagnosis not present

## 2020-12-11 DIAGNOSIS — Z955 Presence of coronary angioplasty implant and graft: Secondary | ICD-10-CM | POA: Insufficient documentation

## 2020-12-11 DIAGNOSIS — S42017A Nondisplaced fracture of sternal end of right clavicle, initial encounter for closed fracture: Secondary | ICD-10-CM

## 2020-12-11 DIAGNOSIS — S42017S Nondisplaced fracture of sternal end of right clavicle, sequela: Secondary | ICD-10-CM | POA: Diagnosis not present

## 2020-12-11 DIAGNOSIS — Y9301 Activity, walking, marching and hiking: Secondary | ICD-10-CM | POA: Diagnosis not present

## 2020-12-11 DIAGNOSIS — R9082 White matter disease, unspecified: Secondary | ICD-10-CM | POA: Diagnosis not present

## 2020-12-11 DIAGNOSIS — G9389 Other specified disorders of brain: Secondary | ICD-10-CM | POA: Diagnosis not present

## 2020-12-11 DIAGNOSIS — M6258 Muscle wasting and atrophy, not elsewhere classified, other site: Secondary | ICD-10-CM | POA: Diagnosis not present

## 2020-12-11 DIAGNOSIS — S4991XA Unspecified injury of right shoulder and upper arm, initial encounter: Secondary | ICD-10-CM | POA: Diagnosis not present

## 2020-12-11 DIAGNOSIS — Z7902 Long term (current) use of antithrombotics/antiplatelets: Secondary | ICD-10-CM | POA: Insufficient documentation

## 2020-12-11 DIAGNOSIS — G319 Degenerative disease of nervous system, unspecified: Secondary | ICD-10-CM | POA: Diagnosis not present

## 2020-12-11 DIAGNOSIS — S0990XA Unspecified injury of head, initial encounter: Secondary | ICD-10-CM | POA: Diagnosis not present

## 2020-12-11 DIAGNOSIS — W19XXXA Unspecified fall, initial encounter: Secondary | ICD-10-CM

## 2020-12-11 DIAGNOSIS — R5381 Other malaise: Secondary | ICD-10-CM | POA: Diagnosis not present

## 2020-12-11 MED ORDER — ACETAMINOPHEN 500 MG PO TABS
1000.0000 mg | ORAL_TABLET | Freq: Once | ORAL | Status: AC
  Administered 2020-12-11: 1000 mg via ORAL
  Filled 2020-12-11: qty 2

## 2020-12-11 MED ORDER — OXYCODONE HCL 5 MG PO TABS: 2.5000 mg | ORAL_TABLET | Freq: Three times a day (TID) | ORAL | 0 refills | Status: DC | PRN

## 2020-12-11 MED ORDER — OXYCODONE HCL 5 MG PO TABS
2.5000 mg | ORAL_TABLET | Freq: Once | ORAL | Status: AC
  Administered 2020-12-11: 2.5 mg via ORAL
  Filled 2020-12-11: qty 1

## 2020-12-11 NOTE — ED Notes (Signed)
Pt to CT

## 2020-12-11 NOTE — ED Triage Notes (Signed)
Pt comes with family from [facility] with c/o confirmed dislocated right shoulder. Pt family states this happened this am and pt fell.  Pt was walking with walker and let go and fell backwards. No LOC or hitting head. Pt is on blood thinners.  Pt states 10/10 pain.

## 2020-12-11 NOTE — ED Notes (Signed)
Assisted patient to commode. Pt family instructed to pull red cord when ready to return to bed. Family verbalized understanding

## 2020-12-11 NOTE — ED Provider Notes (Signed)
[facility] Emergency Department Provider Note  ____________________________________________   Event Date/Time   First MD Initiated Contact with Patient 12/11/20 1701     (approximate)  I have reviewed the triage vital signs and the nursing notes.   HISTORY  Chief Complaint Dislocated Right Shoulder    HPI Sara Conley is a 87 y.o. female with history of hypertension, hyperlipidemia, on Plavix, here with right shoulder pain.  The patient had a mechanical fall this morning.  Patient reportedly often stops short of where she is going to set her walker to the side.  The patient did this this morning, and experienced a fall.  She landed onto her right shoulder.  She was then caught.  Did not directly hit her head.  She has had exquisite pain with any movement of her shoulder.  Pain is sharp and stabbing, worse with movement palpation.  No specific alleviating factors.  No other complaints.        Past Medical History:  Diagnosis Date  . Hyperlipidemia   . Hypertension     There are no problems to display for this patient.   Past Surgical History:  Procedure Laterality Date  . CORONARY ANGIOPLASTY WITH STENT PLACEMENT      Prior to Admission medications   Medication Sig Start Date End Date Taking? Authorizing Provider  oxyCODONE (ROXICODONE) 5 MG immediate release tablet Take 0.5 tablets (2.5 mg total) by mouth every 8 (eight) hours as needed for severe pain. 12/11/20 12/11/21 Yes Shaune Pollack, MD  atorvastatin (LIPITOR) 40 MG tablet Take 40 mg by mouth at bedtime.     [provider]  clopidogrel (PLAVIX) 75 MG tablet Take 75 mg by mouth daily.    [provider]  Doxepin HCl 6 MG TABS Take 6 mg by mouth at bedtime.    [provider]  irbesartan (AVAPRO) 300 MG tablet Take 300 mg by mouth daily.    [provider]  metoprolol succinate (TOPROL-XL) 50 MG 24 hr tablet Take 50 mg by mouth daily. 02/24/20    [provider]    Allergies Patient has no known allergies.  No family history on file.  Social History Social History   Tobacco Use  . Smoking status: Never Smoker  . Smokeless tobacco: Never Used  Substance Use Topics  . Alcohol use: Yes  . Drug use: Never    Review of Systems  Review of Systems  Constitutional: Negative for chills and fever.  HENT: Negative for sore throat.   Respiratory: Negative for shortness of breath.   Cardiovascular: Negative for chest pain.  Gastrointestinal: Negative for abdominal pain.  Genitourinary: Negative for flank pain.  Musculoskeletal: Positive for arthralgias and joint swelling. Negative for neck pain.  Skin: Negative for rash and wound.  Allergic/Immunologic: Negative for immunocompromised state.  Neurological: Negative for weakness and numbness.  Hematological: Does not bruise/bleed easily.  All other systems reviewed and are negative.    ____________________________________________  PHYSICAL EXAM:      VITAL SIGNS: ED Triage Vitals  Enc Vitals Group     BP 12/11/20 1519 (S) (!) 173/58     Pulse Rate 12/11/20 1519 65     Resp 12/11/20 1519 18     Temp 12/11/20 1519 97.9 F (36.6 C)     Temp src --      SpO2 12/11/20 1519 97 %     Weight 12/11/20 1517 84 lb (38.1 kg)     Height 12/11/20 1517 4'  10" (1.473 m)     Head Circumference --      Peak Flow --      Pain Score 12/11/20 1517 10     Pain Loc --      Pain Edu? --      Excl. in GC? --      Physical Exam Vitals and nursing note reviewed.  Constitutional:      General: She is not in acute distress.    Appearance: She is well-developed and well-nourished.  HENT:     Head: Normocephalic and atraumatic.  Eyes:     Conjunctiva/sclera: Conjunctivae normal.  Cardiovascular:     Rate and Rhythm: Normal rate and regular rhythm.     Heart sounds: Normal heart sounds.  Pulmonary:     Effort: Pulmonary effort is normal. No respiratory distress.     Breath  sounds: No wheezing.  Abdominal:     General: There is no distension.  Musculoskeletal:        General: No edema.     Cervical back: Neck supple.     Comments: Marked tenderness to palpation over the right medial anterior clavicle with large area of ecchymoses and contusion.  No open skin wounds.  Tenderness along the posterior right shoulder as well, though this mostly is referred to the clavicle.  Skin:    General: Skin is warm.     Capillary Refill: Capillary refill takes less than 2 seconds.     Findings: No rash.  Neurological:     Mental Status: She is alert and oriented to person, place, and time.     Motor: No abnormal muscle tone.     Comments: Strength 5 out of 5 bilateral upper extremities.  Normal sensation light touch.       ____________________________________________   LABS (all labs ordered are listed, but only abnormal results are displayed)  Labs Reviewed - No data to display  ____________________________________________  EKG: Normal sinus rhythm, ventricular rate 63.  PR 198, QRS 90, QTc 440.  No acute ST elevations or depressions. ________________________________________  RADIOLOGY All imaging, including plain films, CT scans, and ultrasounds, independently reviewed by me, and interpretations confirmed via formal radiology reads.  ED MD interpretation:   CT head: No acute intracranial abnormality. CT shoulder: Clavicle, scapular fracture, no significant displacement, humeral head is located  Official radiology report(s): CT Head Wo Contrast  Result Date: 12/11/2020 CLINICAL DATA:  Larey Seat.  Hit head. EXAM: CT HEAD WITHOUT CONTRAST TECHNIQUE: Contiguous axial images were obtained from the base of the skull through the vertex without intravenous contrast. COMPARISON:  02/25/2020 FINDINGS: Brain: Stable age related cerebral atrophy, ventriculomegaly and periventricular white matter disease. No extra-axial fluid collections are identified. No CT findings for acute  hemispheric infarction or intracranial hemorrhage. No mass lesions. The brainstem and cerebellum are normal. Vascular: Stable vascular calcifications. No aneurysm or hyperdense vessels. Skull: No acute skull fracture.  Lesions. Sinuses/Orbits: Paranasal sinuses and mastoid air cells are clear. The globes are intact. Other: No scalp lesions or scalp hematoma. IMPRESSION: 1. Stable age related cerebral atrophy, ventriculomegaly and periventricular white matter disease. 2. No acute intracranial findings or skull fracture. Electronically Signed   By: Rudie Meyer M.D.   On: 12/11/2020 18:56   CT Shoulder Right Wo Contrast  Result Date: 12/11/2020 CLINICAL DATA:  Larey Seat.  Injured right shoulder. EXAM: CT OF THE UPPER RIGHT EXTREMITY WITHOUT CONTRAST TECHNIQUE: Multidetector CT imaging of the upper right extremity was performed according to the standard protocol.  COMPARISON:  None. FINDINGS: There is a mildly displaced proximal clavicle fracture. The sternoclavicular joint is maintained. [facility] joint is also maintained. Mild degenerative changes for age. Advanced glenohumeral joint degenerative changes with full-thickness cartilage loss, joint space narrowing, osteophytic spurring and subchondral cystic change. No acute humeral head or neck fracture is identified. Nondisplaced fractures of the scapular spine are noted. The scapular body is intact. The visualized right ribs are intact. No definite rib fractures. The visualized right lung is grossly clear. No pneumothorax. The humeral head is riding high in the glenoid fossa with marked narrowing of the humeroacromial space consistent with a chronic rotator cuff tear. Associated marked fatty atrophy of the rotator cuff muscles. IMPRESSION: 1. Mildly displaced proximal clavicle fracture. 2. Advanced glenohumeral joint degenerative changes. 3. Nondisplaced fractures of the scapular spine. 4. Chronic rotator cuff tear with marked fatty atrophy of the rotator cuff muscles.  Electronically Signed   By: Rudie Meyer M.D.   On: 12/11/2020 19:01    ____________________________________________  PROCEDURES   Procedure(s) performed (including Critical Care):  Procedures  ____________________________________________  INITIAL IMPRESSION / MDM / ASSESSMENT AND PLAN / ED COURSE  As part of my medical decision making, I reviewed the following data within the electronic MEDICAL RECORD NUMBER Nursing notes reviewed and incorporated, Old chart reviewed, Notes from prior ED visits, and [place] Controlled Substance Database       *Irean Kendricks was evaluated in Emergency Department on 12/11/2020 for the symptoms described in the history of present illness. She was evaluated in the context of the global COVID-19 pandemic, which necessitated consideration that the patient might be at risk for infection with the SARS-CoV-2 virus that causes COVID-19. Institutional protocols and algorithms that pertain to the evaluation of patients at risk for COVID-19 are in a state of rapid change based on information released by regulatory bodies including the CDC and federal and state organizations. These policies and algorithms were followed during the patient's care in the ED.  Some ED evaluations and interventions may be delayed as a result of limited staffing during the pandemic.*     Medical Decision Making: 87 year old female here with right shoulder pain after mechanical fall.  She was in her usual state of health prior to the fall.  CT head is negative.  CT imaging shows mildly displaced proximal clavicle fracture, nondisplaced fracture of the scapular spine.  She is neurovascularly intact distally.  She has a small hematoma overlying with the clavicle but this is improved with ice and elevation.  Will discharge with brief course of analgesia as needed after risks and benefits were fully discussed with her daughter, who is her POA.  She was placed in a sling.  Refer her to orthopedics as an  outpatient.  ____________________________________________  FINAL CLINICAL IMPRESSION(S) / ED DIAGNOSES  Final diagnoses:  Closed fracture of right scapula, unspecified part of scapula, initial encounter  Closed nondisplaced fracture of sternal end of right clavicle, initial encounter  Fall, initial encounter     MEDICATIONS GIVEN DURING THIS VISIT:  Medications  acetaminophen (TYLENOL) tablet 1,000 mg (1,000 mg Oral Given 12/11/20 1900)  oxyCODONE (Oxy IR/ROXICODONE) immediate release tablet 2.5 mg (2.5 mg Oral Given 12/11/20 2005)     ED Discharge Orders         Ordered    oxyCODONE (ROXICODONE) 5 MG immediate release tablet  Every 8 hours PRN        12/11/20 1936  Note:  This document was prepared using Dragon voice recognition software and may include unintentional dictation errors.   Shaune Pollack, MD 12/11/20 2007

## 2020-12-11 NOTE — Discharge Instructions (Addendum)
Wear the sling at all times until follow-up with your primary or an orthopedist in the next 1 week.  You can apply ice to the area of bruising/swelling throughout the next 24 hours.  No heavy lifting or use of the arm until cleared.  Take Tylenol 500 to 1000 mg every 6 hours as needed for mild to moderate pain.  Do not take more than 4000 mg/day.  Take the prescribed pain medication for severe pain.  I would recommend taking this with a stool softener to prevent constipation.  This can be purchased over-the-counter such as Colace or docusate 100 mg daily.

## 2021-01-09 DIAGNOSIS — M2042 Other hammer toe(s) (acquired), left foot: Secondary | ICD-10-CM | POA: Diagnosis not present

## 2021-01-09 DIAGNOSIS — M79675 Pain in left toe(s): Secondary | ICD-10-CM | POA: Diagnosis not present

## 2021-01-09 DIAGNOSIS — M79674 Pain in right toe(s): Secondary | ICD-10-CM | POA: Diagnosis not present

## 2021-01-09 DIAGNOSIS — B351 Tinea unguium: Secondary | ICD-10-CM | POA: Diagnosis not present

## 2021-01-10 DIAGNOSIS — H353131 Nonexudative age-related macular degeneration, bilateral, early dry stage: Secondary | ICD-10-CM | POA: Diagnosis not present

## 2021-01-23 DIAGNOSIS — F419 Anxiety disorder, unspecified: Secondary | ICD-10-CM | POA: Diagnosis not present

## 2021-01-23 DIAGNOSIS — I2510 Atherosclerotic heart disease of native coronary artery without angina pectoris: Secondary | ICD-10-CM | POA: Diagnosis not present

## 2021-01-23 DIAGNOSIS — E7800 Pure hypercholesterolemia, unspecified: Secondary | ICD-10-CM | POA: Diagnosis not present

## 2021-01-23 DIAGNOSIS — I10 Essential (primary) hypertension: Secondary | ICD-10-CM | POA: Diagnosis not present

## 2021-01-23 DIAGNOSIS — I38 Endocarditis, valve unspecified: Secondary | ICD-10-CM | POA: Diagnosis not present

## 2021-01-23 DIAGNOSIS — Z862 Personal history of diseases of the blood and blood-forming organs and certain disorders involving the immune mechanism: Secondary | ICD-10-CM | POA: Diagnosis not present

## 2021-01-23 DIAGNOSIS — F32A Depression, unspecified: Secondary | ICD-10-CM | POA: Diagnosis not present

## 2021-01-30 DIAGNOSIS — F419 Anxiety disorder, unspecified: Secondary | ICD-10-CM | POA: Diagnosis not present

## 2021-01-30 DIAGNOSIS — Z862 Personal history of diseases of the blood and blood-forming organs and certain disorders involving the immune mechanism: Secondary | ICD-10-CM | POA: Diagnosis not present

## 2021-01-30 DIAGNOSIS — E7800 Pure hypercholesterolemia, unspecified: Secondary | ICD-10-CM | POA: Diagnosis not present

## 2021-01-30 DIAGNOSIS — F32A Depression, unspecified: Secondary | ICD-10-CM | POA: Diagnosis not present

## 2021-01-30 DIAGNOSIS — I10 Essential (primary) hypertension: Secondary | ICD-10-CM | POA: Diagnosis not present

## 2021-03-07 ENCOUNTER — Emergency Department: Payer: PPO

## 2021-03-07 ENCOUNTER — Other Ambulatory Visit: Payer: Self-pay

## 2021-03-07 ENCOUNTER — Inpatient Hospital Stay
Admission: EM | Admit: 2021-03-07 | Discharge: 2021-03-24 | DRG: 481 | Disposition: A | Discharge status: Hospice | Payer: PPO | Attending: Internal Medicine | Admitting: Internal Medicine

## 2021-03-07 DIAGNOSIS — Z79899 Other long term (current) drug therapy: Secondary | ICD-10-CM | POA: Diagnosis not present

## 2021-03-07 DIAGNOSIS — E7800 Pure hypercholesterolemia, unspecified: Secondary | ICD-10-CM | POA: Diagnosis present

## 2021-03-07 DIAGNOSIS — B9620 Unspecified Escherichia coli [E. coli] as the cause of diseases classified elsewhere: Secondary | ICD-10-CM | POA: Diagnosis not present

## 2021-03-07 DIAGNOSIS — N3000 Acute cystitis without hematuria: Secondary | ICD-10-CM

## 2021-03-07 DIAGNOSIS — T445X5A Adverse effect of predominantly beta-adrenoreceptor agonists, initial encounter: Secondary | ICD-10-CM | POA: Diagnosis not present

## 2021-03-07 DIAGNOSIS — S72009A Fracture of unspecified part of neck of unspecified femur, initial encounter for closed fracture: Secondary | ICD-10-CM | POA: Diagnosis present

## 2021-03-07 DIAGNOSIS — W19XXXA Unspecified fall, initial encounter: Secondary | ICD-10-CM

## 2021-03-07 DIAGNOSIS — E785 Hyperlipidemia, unspecified: Secondary | ICD-10-CM | POA: Diagnosis not present

## 2021-03-07 DIAGNOSIS — F0390 Unspecified dementia without behavioral disturbance: Secondary | ICD-10-CM | POA: Diagnosis present

## 2021-03-07 DIAGNOSIS — I959 Hypotension, unspecified: Secondary | ICD-10-CM | POA: Diagnosis not present

## 2021-03-07 DIAGNOSIS — D539 Nutritional anemia, unspecified: Secondary | ICD-10-CM | POA: Diagnosis not present

## 2021-03-07 DIAGNOSIS — R0902 Hypoxemia: Secondary | ICD-10-CM | POA: Diagnosis not present

## 2021-03-07 DIAGNOSIS — M25552 Pain in left hip: Secondary | ICD-10-CM | POA: Diagnosis not present

## 2021-03-07 DIAGNOSIS — M2550 Pain in unspecified joint: Secondary | ICD-10-CM | POA: Diagnosis not present

## 2021-03-07 DIAGNOSIS — Z955 Presence of coronary angioplasty implant and graft: Secondary | ICD-10-CM

## 2021-03-07 DIAGNOSIS — Z043 Encounter for examination and observation following other accident: Secondary | ICD-10-CM | POA: Diagnosis not present

## 2021-03-07 DIAGNOSIS — Z7902 Long term (current) use of antithrombotics/antiplatelets: Secondary | ICD-10-CM | POA: Diagnosis not present

## 2021-03-07 DIAGNOSIS — I2510 Atherosclerotic heart disease of native coronary artery without angina pectoris: Secondary | ICD-10-CM | POA: Diagnosis not present

## 2021-03-07 DIAGNOSIS — S72142A Displaced intertrochanteric fracture of left femur, initial encounter for closed fracture: Secondary | ICD-10-CM | POA: Diagnosis not present

## 2021-03-07 DIAGNOSIS — W010XXA Fall on same level from slipping, tripping and stumbling without subsequent striking against object, initial encounter: Secondary | ICD-10-CM | POA: Diagnosis present

## 2021-03-07 DIAGNOSIS — N390 Urinary tract infection, site not specified: Secondary | ICD-10-CM | POA: Diagnosis not present

## 2021-03-07 DIAGNOSIS — R54 Age-related physical debility: Secondary | ICD-10-CM | POA: Diagnosis not present

## 2021-03-07 DIAGNOSIS — I38 Endocarditis, valve unspecified: Secondary | ICD-10-CM | POA: Diagnosis not present

## 2021-03-07 DIAGNOSIS — F32A Depression, unspecified: Secondary | ICD-10-CM | POA: Diagnosis present

## 2021-03-07 DIAGNOSIS — Y92009 Unspecified place in unspecified non-institutional (private) residence as the place of occurrence of the external cause: Secondary | ICD-10-CM | POA: Diagnosis not present

## 2021-03-07 DIAGNOSIS — S72002A Fracture of unspecified part of neck of left femur, initial encounter for closed fracture: Secondary | ICD-10-CM | POA: Diagnosis not present

## 2021-03-07 DIAGNOSIS — L8990 Pressure ulcer of unspecified site, unspecified stage: Secondary | ICD-10-CM | POA: Insufficient documentation

## 2021-03-07 DIAGNOSIS — Z419 Encounter for procedure for purposes other than remedying health state, unspecified: Secondary | ICD-10-CM

## 2021-03-07 DIAGNOSIS — R404 Transient alteration of awareness: Secondary | ICD-10-CM | POA: Diagnosis not present

## 2021-03-07 DIAGNOSIS — D696 Thrombocytopenia, unspecified: Secondary | ICD-10-CM | POA: Diagnosis present

## 2021-03-07 DIAGNOSIS — I10 Essential (primary) hypertension: Secondary | ICD-10-CM | POA: Diagnosis present

## 2021-03-07 DIAGNOSIS — Z01818 Encounter for other preprocedural examination: Secondary | ICD-10-CM | POA: Diagnosis not present

## 2021-03-07 DIAGNOSIS — G4709 Other insomnia: Secondary | ICD-10-CM | POA: Diagnosis present

## 2021-03-07 DIAGNOSIS — R001 Bradycardia, unspecified: Secondary | ICD-10-CM | POA: Diagnosis not present

## 2021-03-07 DIAGNOSIS — F419 Anxiety disorder, unspecified: Secondary | ICD-10-CM | POA: Diagnosis present

## 2021-03-07 DIAGNOSIS — Z20822 Contact with and (suspected) exposure to covid-19: Secondary | ICD-10-CM | POA: Diagnosis not present

## 2021-03-07 DIAGNOSIS — Z66 Do not resuscitate: Secondary | ICD-10-CM | POA: Diagnosis not present

## 2021-03-07 DIAGNOSIS — E875 Hyperkalemia: Secondary | ICD-10-CM | POA: Diagnosis not present

## 2021-03-07 DIAGNOSIS — Y92230 Patient room in hospital as the place of occurrence of the external cause: Secondary | ICD-10-CM | POA: Diagnosis not present

## 2021-03-07 DIAGNOSIS — S0990XA Unspecified injury of head, initial encounter: Secondary | ICD-10-CM | POA: Diagnosis not present

## 2021-03-07 DIAGNOSIS — F0391 Unspecified dementia with behavioral disturbance: Secondary | ICD-10-CM | POA: Diagnosis not present

## 2021-03-07 DIAGNOSIS — Z7401 Bed confinement status: Secondary | ICD-10-CM | POA: Diagnosis not present

## 2021-03-07 LAB — URINALYSIS, COMPLETE (UACMP) WITH MICROSCOPIC
Bilirubin Urine: NEGATIVE
Glucose, UA: NEGATIVE mg/dL
Hgb urine dipstick: NEGATIVE
Ketones, ur: NEGATIVE mg/dL
Nitrite: POSITIVE — AB
Protein, ur: 30 mg/dL — AB
Specific Gravity, Urine: 1.02 (ref 1.005–1.030)
pH: 5 (ref 5.0–8.0)

## 2021-03-07 LAB — CBC WITH DIFFERENTIAL/PLATELET
Abs Immature Granulocytes: 0.08 10*3/uL — ABNORMAL HIGH (ref 0.00–0.07)
Basophils Absolute: 0.1 10*3/uL (ref 0.0–0.1)
Basophils Relative: 1 %
Eosinophils Absolute: 0.2 10*3/uL (ref 0.0–0.5)
Eosinophils Relative: 2 %
HCT: 32.6 % — ABNORMAL LOW (ref 36.0–46.0)
Hemoglobin: 10.7 g/dL — ABNORMAL LOW (ref 12.0–15.0)
Immature Granulocytes: 1 %
Lymphocytes Relative: 11 %
Lymphs Abs: 1.2 10*3/uL (ref 0.7–4.0)
MCH: 31.9 pg (ref 26.0–34.0)
MCHC: 32.8 g/dL (ref 30.0–36.0)
MCV: 97.3 fL (ref 80.0–100.0)
Monocytes Absolute: 1 10*3/uL (ref 0.1–1.0)
Monocytes Relative: 9 %
Neutro Abs: 8.6 10*3/uL — ABNORMAL HIGH (ref 1.7–7.7)
Neutrophils Relative %: 76 %
Platelets: 189 10*3/uL (ref 150–400)
RBC: 3.35 MIL/uL — ABNORMAL LOW (ref 3.87–5.11)
RDW: 15.4 % (ref 11.5–15.5)
WBC: 11.1 10*3/uL — ABNORMAL HIGH (ref 4.0–10.5)
nRBC: 0 % (ref 0.0–0.2)

## 2021-03-07 LAB — COMPREHENSIVE METABOLIC PANEL
ALT: 11 U/L (ref 0–44)
AST: 23 U/L (ref 15–41)
Albumin: 3.5 g/dL (ref 3.5–5.0)
Alkaline Phosphatase: 51 U/L (ref 38–126)
Anion gap: 9 (ref 5–15)
BUN: 34 mg/dL — ABNORMAL HIGH (ref 8–23)
CO2: 23 mmol/L (ref 22–32)
Calcium: 8.7 mg/dL — ABNORMAL LOW (ref 8.9–10.3)
Chloride: 103 mmol/L (ref 98–111)
Creatinine, Ser: 0.82 mg/dL (ref 0.44–1.00)
GFR, Estimated: >60 mL/min (ref >60)
Glucose, Bld: 115 mg/dL — ABNORMAL HIGH (ref 70–99)
Potassium: 4.5 mmol/L (ref 3.5–5.1)
Sodium: 135 mmol/L (ref 135–145)
Total Bilirubin: 0.5 mg/dL (ref 0.3–1.2)
Total Protein: 6.1 g/dL — ABNORMAL LOW (ref 6.5–8.1)

## 2021-03-07 LAB — TYPE AND SCREEN
ABO/RH(D): O POS
Antibody Screen: NEGATIVE

## 2021-03-07 LAB — RESP PANEL BY RT-PCR (FLU A&B, COVID) ARPGX2
Influenza A by PCR: NEGATIVE
Influenza B by PCR: NEGATIVE
SARS Coronavirus 2 by RT PCR: NEGATIVE

## 2021-03-07 MED ORDER — LACTATED RINGERS IV SOLN: INTRAVENOUS | Status: DC

## 2021-03-07 MED ORDER — ACETAMINOPHEN 325 MG PO TABS
650.0000 mg | ORAL_TABLET | Freq: Four times a day (QID) | ORAL | Status: DC | PRN
  Administered 2021-03-15 – 2021-03-23 (×10): 650 mg via ORAL
  Filled 2021-03-07 (×11): qty 2

## 2021-03-07 MED ORDER — DOXEPIN HCL 10 MG PO CAPS
10.0000 mg | ORAL_CAPSULE | Freq: Every day | ORAL | Status: DC
  Administered 2021-03-07 – 2021-03-23 (×15): 10 mg via ORAL
  Filled 2021-03-07 (×18): qty 1

## 2021-03-07 MED ORDER — SODIUM CHLORIDE 0.9 % IV SOLN
1.0000 g | Freq: Once | INTRAVENOUS | Status: AC
  Administered 2021-03-07: 1 g via INTRAVENOUS
  Filled 2021-03-07: qty 10

## 2021-03-07 MED ORDER — IRBESARTAN 150 MG PO TABS
300.0000 mg | ORAL_TABLET | Freq: Every day | ORAL | Status: DC
  Filled 2021-03-07 (×4): qty 2

## 2021-03-07 MED ORDER — ONDANSETRON HCL 4 MG/2ML IJ SOLN: 4.0000 mg | Freq: Four times a day (QID) | INTRAMUSCULAR | Status: DC | PRN

## 2021-03-07 MED ORDER — ATORVASTATIN CALCIUM 20 MG PO TABS
40.0000 mg | ORAL_TABLET | Freq: Every day | ORAL | Status: DC
  Administered 2021-03-09 – 2021-03-23 (×14): 40 mg via ORAL
  Filled 2021-03-07 (×16): qty 2

## 2021-03-07 MED ORDER — KETOROLAC TROMETHAMINE 15 MG/ML IJ SOLN
15.0000 mg | Freq: Four times a day (QID) | INTRAMUSCULAR | Status: AC | PRN
  Administered 2021-03-07 – 2021-03-12 (×2): 15 mg via INTRAVENOUS
  Filled 2021-03-07 (×3): qty 1

## 2021-03-07 MED ORDER — FENTANYL CITRATE (PF) 100 MCG/2ML IJ SOLN
25.0000 ug | Freq: Once | INTRAMUSCULAR | Status: AC
  Administered 2021-03-07: 25 ug via INTRAVENOUS
  Filled 2021-03-07: qty 2

## 2021-03-07 MED ORDER — ACETAMINOPHEN 650 MG RE SUPP: 650.0000 mg | Freq: Four times a day (QID) | RECTAL | Status: DC | PRN

## 2021-03-07 MED ORDER — SODIUM CHLORIDE 0.9 % IV SOLN
1.0000 g | INTRAVENOUS | Status: DC
  Administered 2021-03-08 – 2021-03-11 (×4): 1 g via INTRAVENOUS
  Filled 2021-03-07: qty 10
  Filled 2021-03-07: qty 1
  Filled 2021-03-07: qty 10
  Filled 2021-03-07 (×2): qty 1

## 2021-03-07 MED ORDER — ONDANSETRON HCL 4 MG PO TABS: 4.0000 mg | ORAL_TABLET | Freq: Four times a day (QID) | ORAL | Status: DC | PRN

## 2021-03-07 MED ORDER — ENOXAPARIN SODIUM 40 MG/0.4ML IJ SOSY
40.0000 mg | PREFILLED_SYRINGE | INTRAMUSCULAR | Status: DC
  Administered 2021-03-07: 40 mg via SUBCUTANEOUS
  Filled 2021-03-07: qty 0.4

## 2021-03-07 MED ORDER — METOPROLOL SUCCINATE ER 50 MG PO TB24
50.0000 mg | ORAL_TABLET | Freq: Every day | ORAL | Status: DC
  Administered 2021-03-09: 50 mg via ORAL
  Filled 2021-03-07 (×5): qty 1

## 2021-03-07 MED ORDER — ONDANSETRON HCL 4 MG/2ML IJ SOLN
4.0000 mg | Freq: Once | INTRAMUSCULAR | Status: AC
  Administered 2021-03-07: 4 mg via INTRAVENOUS
  Filled 2021-03-07: qty 2

## 2021-03-07 MED ORDER — CLONIDINE HCL 0.1 MG PO TABS: 0.1000 mg | ORAL_TABLET | Freq: Four times a day (QID) | ORAL | Status: DC | PRN

## 2021-03-07 NOTE — ED Triage Notes (Signed)
See first nurse note- pt fell while walking today. No LOC. Complaints of left femur pain. Skin tear present to L upper arm. Pt with daughter

## 2021-03-07 NOTE — ED Provider Notes (Signed)
[facility] Emergency Department Provider Note  ____________________________________________   Event Date/Time   First MD Initiated Contact with Patient 03/07/21 1727     (approximate)  I have reviewed the triage vital signs and the nursing notes.   HISTORY  Chief Complaint Fall    HPI Sara Conley is a [age] y.o. female with history of hypertension hyperlipidemia here with hip pain.  The patient reportedly had a mechanical fall today.  History provided primarily by her daughter.  Patient has some mild dementia and also speaks [place].  Patient was in her usual state of health today.  She has not been recently ill.  She had a mechanical fall and tripped while using her walker in the restroom.  Fell directly onto her left hip.  Sustained immediate onset of what she describes as severe pain.  No alleviating factors.  No distal numbness or weakness.  No head injury.  She has not        Past Medical History:  Diagnosis Date  . Hyperlipidemia   . Hypertension     Patient Active Problem List   Diagnosis Date Noted  . left Hip fracture (HCC) 03/07/2021  . Acute lower UTI 03/07/2021  . Anxiety and depression 08/17/2020  . Valvular heart disease 11/22/2019  . Coronary artery disease involving native coronary artery of native heart without angina pectoris 11/11/2019  . Essential hypertension 11/11/2019  . Other insomnia 11/11/2019  . Pure hypercholesterolemia 11/11/2019    Past Surgical History:  Procedure Laterality Date  . CORONARY ANGIOPLASTY WITH STENT PLACEMENT      Prior to Admission medications   Medication Sig Start Date End Date Taking? Authorizing Provider  atorvastatin (LIPITOR) 40 MG tablet Take 40 mg by mouth at bedtime.    Yes [provider]  cholecalciferol (VITAMIN D3) 25 MCG (1000 UNIT) tablet Take 1,000 Units by mouth daily.   Yes [provider]  clopidogrel (PLAVIX) 75 MG tablet Take 75 mg by mouth daily.    Yes [provider]  escitalopram (LEXAPRO) 10 MG tablet Take 10 mg by mouth at bedtime. 01/03/21  Yes [provider]  irbesartan (AVAPRO) 300 MG tablet Take 300 mg by mouth daily.   Yes [provider]  melatonin 3 MG TABS tablet Take 3 mg by mouth at bedtime.   Yes [provider]  metoprolol succinate (TOPROL-XL) 50 MG 24 hr tablet Take 50 mg by mouth daily. 02/24/20  Yes [provider]  oxyCODONE (ROXICODONE) 5 MG immediate release tablet Take 0.5 tablets (2.5 mg total) by mouth every 8 (eight) hours as needed for severe pain. Patient not taking: No sig reported 12/11/20 12/11/21  Shaune PollackIsaacs, Lakiesha Ralphs, MD    Allergies Patient has no known allergies.  No family history on file.  Social History Social History   Tobacco Use  . Smoking status: Never Smoker  . Smokeless tobacco: Never Used  Substance Use Topics  . Alcohol use: Yes  . Drug use: Never    Review of Systems  Review of Systems  Constitutional: Negative for chills and fever.  HENT: Negative for sore throat.   Respiratory: Negative for shortness of breath.   Cardiovascular: Negative for chest pain.  Gastrointestinal: Negative for abdominal pain.  Genitourinary: Negative for flank pain.  Musculoskeletal: Positive for arthralgias and gait problem. Negative for neck pain.  Skin: Negative for rash and wound.  Allergic/Immunologic: Negative for immunocompromised state.  Neurological: Negative for weakness and numbness.  Hematological: Does not bruise/bleed  easily.     ____________________________________________  PHYSICAL EXAM:      VITAL SIGNS: ED Triage Vitals [03/07/21 1717]  Enc Vitals Group     BP (!) 207/66     Pulse Rate (!) 59     Resp 16     Temp 98.6 F (37 C)     Temp Source Oral     SpO2 95 %     Weight 94 lb (42.6 kg)     Height 4\' 10"  (1.473 m)     Head Circumference      Peak Flow      Pain Score 10     Pain Loc      Pain Edu?      Excl. in GC?       Physical Exam Vitals and nursing note reviewed.  Constitutional:      General: She is not in acute distress.    Appearance: She is well-developed.  HENT:     Head: Normocephalic and atraumatic.  Eyes:     Conjunctiva/sclera: Conjunctivae normal.  Cardiovascular:     Rate and Rhythm: Normal rate and regular rhythm.     Heart sounds: Normal heart sounds.     Comments: 2+ DP/PT pulses Pulmonary:     Effort: Pulmonary effort is normal. No respiratory distress.     Breath sounds: No wheezing.  Abdominal:     General: There is no distension.  Musculoskeletal:     Cervical back: Neck supple.     Comments: Significant TTP over left lateral hip/greater troch, with slight shortening and external rotation.   Skin:    General: Skin is warm.     Capillary Refill: Capillary refill takes less than 2 seconds.     Findings: No rash.  Neurological:     Mental Status: She is alert and oriented to person, place, and time.     Motor: No abnormal muscle tone.       ____________________________________________   LABS (all labs ordered are listed, but only abnormal results are displayed)  Labs Reviewed  CBC WITH DIFFERENTIAL/PLATELET - Abnormal; Notable for the following components:      Result Value   WBC 11.1 (*)    RBC 3.35 (*)    Hemoglobin 10.7 (*)    HCT 32.6 (*)    Neutro Abs 8.6 (*)    Abs Immature Granulocytes 0.08 (*)    All other components within normal limits  URINALYSIS, COMPLETE (UACMP) WITH MICROSCOPIC - Abnormal; Notable for the following components:   Color, Urine YELLOW (*)    APPearance HAZY (*)    Protein, ur 30 (*)    Nitrite POSITIVE (*)    Leukocytes,Ua TRACE (*)    Bacteria, UA RARE (*)    All other components within normal limits  COMPREHENSIVE METABOLIC PANEL - Abnormal; Notable for the following components:   Glucose, Bld 115 (*)    BUN 34 (*)    Calcium 8.7 (*)    Total Protein 6.1 (*)    All other components within normal limits  RESP PANEL  BY RT-PCR (FLU A&B, COVID) [id number]  URINE CULTURE  COMPREHENSIVE METABOLIC PANEL  TYPE AND SCREEN    ____________________________________________  EKG: sinus bradycardia, VR 58. PR 202, QRS 90, QTc 433. No acute ST elevation or depression. No ischemia or infarct. ________________________________________  RADIOLOGY All imaging, including plain films, CT scans, and ultrasounds, independently reviewed by me, and interpretations confirmed via formal radiology reads.  ED MD interpretation:  CT Head; NAICA CXR: Clear XR Hip: Comminuted IT fx  Official radiology report(s): CT Head Wo Contrast  Result Date: 03/07/2021 CLINICAL DATA:  Larey Seat EXAM: CT HEAD WITHOUT CONTRAST TECHNIQUE: Contiguous axial images were obtained from the base of the skull through the vertex without intravenous contrast. COMPARISON:  12/11/2020 FINDINGS: Brain: Scattered hypodensities throughout the periventricular and subcortical white matter are consistent with chronic small vessel ischemic changes, stable. No acute infarct or hemorrhage. Lateral ventricles and midline structures are stable. No acute extra-axial fluid collections. No mass effect. Vascular: No hyperdense vessel or unexpected calcification. Skull: Normal. Negative for fracture or focal lesion. Sinuses/Orbits: No acute finding. Other: None. IMPRESSION: 1. Stable exam, no acute intracranial process. Electronically Signed   By: Sharlet Salina M.D.   On: 03/07/2021 18:39   DG Chest Portable 1 View  Result Date: 03/07/2021 CLINICAL DATA:  Tripped and fell, left hip pain EXAM: PORTABLE CHEST 1 VIEW COMPARISON:  None. FINDINGS: Single frontal view of the chest demonstrates an unremarkable cardiac silhouette. Atherosclerosis of the aortic arch. No airspace disease, effusion, or pneumothorax. No acute displaced fractures. The bones are diffusely osteopenic. IMPRESSION: 1. No acute intrathoracic process. Electronically Signed   By: Sharlet Salina M.D.   On: 03/07/2021  18:53   DG HIP UNILAT WITH PELVIS 2-3 VIEWS LEFT  Result Date: 03/07/2021 CLINICAL DATA:  Tripped and fell, left hip pain EXAM: DG HIP (WITH OR WITHOUT PELVIS) 2-3V LEFT COMPARISON:  None. FINDINGS: Frontal view of the pelvis as well as frontal and cross-table lateral views of the left hip are obtained. There is a comminuted intertrochanteric left hip fracture with mild impaction and varus angulation at the fracture site. No dislocation. The remainder of the bony pelvis is unremarkable. Right hip arthroplasty is identified without complication. IMPRESSION: 1. Comminuted intertrochanteric left hip fracture as above. Electronically Signed   By: Sharlet Salina M.D.   On: 03/07/2021 18:54    ____________________________________________  PROCEDURES   Procedure(s) performed (including Critical Care):  .1-3 Lead EKG Interpretation Performed by: Shaune Pollack, MD Authorized by: Shaune Pollack, MD     Interpretation: normal     ECG rate:  50-60   ECG rate assessment: normal     Rhythm: sinus bradycardia     Ectopy: none     Conduction: normal   Comments:     Indication: receiving iv analgesia    ____________________________________________  INITIAL IMPRESSION / MDM / ASSESSMENT AND PLAN / ED COURSE  As part of my medical decision making, I reviewed the following data within the electronic MEDICAL RECORD NUMBER Nursing notes reviewed and incorporated, Old chart reviewed, Notes from prior ED visits, and [place] Controlled Substance Database       *Gratia Disla was evaluated in Emergency Department on 03/07/2021 for the symptoms described in the history of present illness. She was evaluated in the context of the global COVID-19 pandemic, which necessitated consideration that the patient might be at risk for infection with the SARS-CoV-2 virus that causes COVID-19. Institutional protocols and algorithms that pertain to the evaluation of patients at risk for COVID-19 are in a state of rapid change  based on information released by regulatory bodies including the CDC and federal and state organizations. These policies and algorithms were followed during the patient's care in the ED.  Some ED evaluations and interventions may be delayed as a result of limited staffing during the pandemic.*     Medical Decision Making:  87 yo F here with left hip pain  after mechanical fall. Imaging shows left IT hip fx. Ct head reviewed and is negative. CXR clear. Labs show mild leukocytosis. UA is concerning for UTI. No fever or signs of sepsis/pyelo. Will start on abx, admit. Dr. Odis Luster consulted with orthopedics. Pt is on Plavix, will require wash out period. Family updated and in agreement.  ____________________________________________  FINAL CLINICAL IMPRESSION(S) / ED DIAGNOSES  Final diagnoses:  Closed displaced intertrochanteric fracture of left femur, initial encounter (HCC)  Acute cystitis without hematuria     MEDICATIONS GIVEN DURING THIS VISIT:  Medications  enoxaparin (LOVENOX) injection 40 mg (has no administration in time range)  lactated ringers infusion (has no administration in time range)  acetaminophen (TYLENOL) tablet 650 mg (has no administration in time range)    Or  acetaminophen (TYLENOL) suppository 650 mg (has no administration in time range)  ketorolac (TORADOL) 15 MG/ML injection 15 mg (has no administration in time range)  ondansetron (ZOFRAN) tablet 4 mg (has no administration in time range)    Or  ondansetron (ZOFRAN) injection 4 mg (has no administration in time range)  cefTRIAXone (ROCEPHIN) 1 g in sodium chloride 0.9 % 100 mL IVPB (has no administration in time range)  cloNIDine (CATAPRES) tablet 0.1 mg (has no administration in time range)  atorvastatin (LIPITOR) tablet 40 mg (has no administration in time range)  Doxepin HCl TABS 6 mg (has no administration in time range)  irbesartan (AVAPRO) tablet 300 mg (has no administration in time range)  metoprolol  succinate (TOPROL-XL) 24 hr tablet 50 mg (has no administration in time range)  fentaNYL (SUBLIMAZE) injection 25 mcg (25 mcg Intravenous Given 03/07/21 1849)  ondansetron (ZOFRAN) injection 4 mg (4 mg Intravenous Given 03/07/21 1850)  cefTRIAXone (ROCEPHIN) 1 g in sodium chloride 0.9 % 100 mL IVPB (0 g Intravenous Stopped 03/07/21 2117)     ED Discharge Orders    None       Note:  This document was prepared using Dragon voice recognition software and may include unintentional dictation errors.   Shaune Pollack, MD 03/07/21 2237

## 2021-03-07 NOTE — H&P (Signed)
History and Physical   Sara Conley HCW:[id number] DOB: 1918-04-22 DOA: 03/07/2021  Referring MD/NP/PA: Dr. Erma Heritage  PCP: Marisue Ivan, MD   Outpatient Specialists: None  Patient coming from: Home  Chief Complaint: Fall with left hip pain  HPI: Sara Conley is a 87 y.o. female with medical history significant of hypertension, hyperlipidemia, dementia, who only speaks [place] brought in by her daughter after sustaining a mechanical fall at home and having pain on her left side.  She tripped low while using her walker in the restroom.  She fell on her left side.  There was severe pain in the area and she called out to her daughter.  Patient was seen in the ER and has left intertrochanteric fracture.  Orthopedics consulted but patient is on Plavix.  Recommendation is for admission and getting patient ready for surgery probably in about 3 days.  She did not hit her head.  Patient is frail but has been apparently doing well in her usual state of health.  Able to do her ADLs according to the daughter.  She was using the walker but can move around the house.  She otherwise has no other complaints.  No other areas of injury..  ED Course: Temperature is 98.6 initial blood pressure 207/66 with pulse 59 respirate of 16 oxygen sat 87% on room air.  Currently 100%.  White count is 11.1 hemoglobin 10.7 platelets of 189.  Chemistry mostly within normal except BUN 35 calcium 8.7.  Glucose of 115.  Viral screen including COVID-19 is negative urinalysis showed WBC 16 with rare bacteria and positive nitrite.  Head CT without contrast showed no acute findings.  Chest x-ray showed no significant findings.  X-ray of the left hip showed comminuted intertrochanteric left hip fracture.  Patient being admitted to the hospital for further evaluation and treatment.  Review of Systems: As per HPI otherwise 10 point review of systems negative.    Past Medical History:  Diagnosis Date  . Hyperlipidemia   .  Hypertension     Past Surgical History:  Procedure Laterality Date  . CORONARY ANGIOPLASTY WITH STENT PLACEMENT       reports that she has never smoked. She has never used smokeless tobacco. She reports current alcohol use. She reports that she does not use drugs.  No Known Allergies  No family history on file.   Prior to Admission medications   Medication Sig Start Date End Date Taking? Authorizing Provider  atorvastatin (LIPITOR) 40 MG tablet Take 40 mg by mouth at bedtime.     [provider]  clopidogrel (PLAVIX) 75 MG tablet Take 75 mg by mouth daily.    [provider]  Doxepin HCl 6 MG TABS Take 6 mg by mouth at bedtime.    [provider]  irbesartan (AVAPRO) 300 MG tablet Take 300 mg by mouth daily.    [provider]  metoprolol succinate (TOPROL-XL) 50 MG 24 hr tablet Take 50 mg by mouth daily. 02/24/20   [provider]  oxyCODONE (ROXICODONE) 5 MG immediate release tablet Take 0.5 tablets (2.5 mg total) by mouth every 8 (eight) hours as needed for severe pain. 12/11/20 12/11/21  Shaune Pollack, MD    Physical Exam: Vitals:   03/07/21 1717 03/07/21 1934  BP: (!) 207/66   Pulse: (!) 59   Resp: 16   Temp: 98.6 F (37 C)   TempSrc: Oral   SpO2: 95% 98%  Weight: 42.6 kg   Height: 4\' 10"  (1.473 m)  Constitutional: Frail, chronically ill looking: In mild distress due to pain Vitals:   03/07/21 1717 03/07/21 1934  BP: (!) 207/66   Pulse: (!) 59   Resp: 16   Temp: 98.6 F (37 C)   TempSrc: Oral   SpO2: 95% 98%  Weight: 42.6 kg   Height: 4\' 10"  (1.473 m)    Eyes: PERRL, lids and conjunctivae normal ENMT: Mucous membranes are dry. Posterior pharynx clear of any exudate or lesions.Normal dentition.  Neck: normal, supple, no masses, no thyromegaly Respiratory: clear to auscultation bilaterally, no wheezing, no crackles. Normal respiratory effort. No accessory muscle use.  Cardiovascular: Sinus bradycardia, no  murmurs / rubs / gallops. No extremity edema. 2+ pedal pulses. No carotid bruits.  Abdomen: no tenderness, no masses palpated. No hepatosplenomegaly. Bowel sounds positive.  Musculoskeletal: no clubbing / cyanosis.  Left lower extremity laterally rotated and shortened no contractures. Normal muscle tone.  Skin: no rashes, lesions, ulcers. No induration Neurologic: CN 2-12 grossly intact. Sensation intact, DTR normal. Strength 5/5 in all 4.  Psychiatric: Normal judgment and insight. Alert and oriented x 3. Normal mood.     Labs on Admission: I have personally reviewed following labs and imaging studies  CBC: Recent Labs  Lab 03/07/21 1859  WBC 11.1*  NEUTROABS 8.6*  HGB 10.7*  HCT 32.6*  MCV 97.3  PLT 189   Basic Metabolic Panel: No results for input(s): NA, K, CL, CO2, GLUCOSE, BUN, CREATININE, CALCIUM, MG, PHOS in the last 168 hours. GFR: CrCl cannot be calculated (No successful lab value found.). Liver Function Tests: No results for input(s): AST, ALT, ALKPHOS, BILITOT, PROT, ALBUMIN in the last 168 hours. No results for input(s): LIPASE, AMYLASE in the last 168 hours. No results for input(s): AMMONIA in the last 168 hours. Coagulation Profile: No results for input(s): INR, PROTIME in the last 168 hours. Cardiac Enzymes: No results for input(s): CKTOTAL, CKMB, CKMBINDEX, TROPONINI in the last 168 hours. BNP (last 3 results) No results for input(s): PROBNP in the last 8760 hours. HbA1C: No results for input(s): HGBA1C in the last 72 hours. CBG: No results for input(s): GLUCAP in the last 168 hours. Lipid Profile: No results for input(s): CHOL, HDL, LDLCALC, TRIG, CHOLHDL, LDLDIRECT in the last 72 hours. Thyroid Function Tests: No results for input(s): TSH, T4TOTAL, FREET4, T3FREE, THYROIDAB in the last 72 hours. Anemia Panel: No results for input(s): VITAMINB12, FOLATE, FERRITIN, TIBC, IRON, RETICCTPCT in the last 72 hours. Urine analysis:    Component Value Date/Time    COLORURINE YELLOW (A) 03/07/2021 1859   APPEARANCEUR HAZY (A) 03/07/2021 1859   LABSPEC 1.020 03/07/2021 1859   PHURINE 5.0 03/07/2021 1859   GLUCOSEU NEGATIVE 03/07/2021 1859   HGBUR NEGATIVE 03/07/2021 1859   BILIRUBINUR NEGATIVE 03/07/2021 1859   KETONESUR NEGATIVE 03/07/2021 1859   PROTEINUR 30 (A) 03/07/2021 1859   NITRITE POSITIVE (A) 03/07/2021 1859   LEUKOCYTESUR TRACE (A) 03/07/2021 1859   Sepsis Labs: @LABRCNTIP (procalcitonin:4,lacticidven:4) ) Recent Results (from the past 240 hour(s))  Resp Panel by RT-PCR (Flu A&B, Covid) Nasopharyngeal Swab     Status: None   Collection Time: 03/07/21  6:59 PM   Specimen: Nasopharyngeal Swab; Nasopharyngeal(NP) swabs in vial transport medium  Result Value Ref Range Status   SARS Coronavirus 2 by RT PCR NEGATIVE NEGATIVE Final    Comment: (NOTE) SARS-CoV-2 target nucleic acids are NOT DETECTED.  The SARS-CoV-2 RNA is generally detectable in upper respiratory specimens during the acute phase of infection. The lowest concentration of SARS-CoV-2  viral copies this assay can detect is 138 copies/mL. A negative result does not preclude SARS-Cov-2 infection and should not be used as the sole basis for treatment or other patient management decisions. A negative result may occur with  improper specimen collection/handling, submission of specimen other than nasopharyngeal swab, presence of viral mutation(s) within the areas targeted by this assay, and inadequate number of viral copies(<138 copies/mL). A negative result must be combined with clinical observations, patient history, and epidemiological information. The expected result is Negative.  Fact Sheet for Patients:  BloggerCourse.com  Fact Sheet for Healthcare Providers:  SeriousBroker.it  This test is no t yet approved or cleared by the [place] FDA and  has been authorized for detection and/or diagnosis of SARS-CoV-2  by FDA under an Emergency Use Authorization (EUA). This EUA will remain  in effect (meaning this test can be used) for the duration of the COVID-19 declaration under Section 564(b)(1) of the Act, 21 U.S.C.section 360bbb-3(b)(1), unless the authorization is terminated  or revoked sooner.       Influenza A by PCR NEGATIVE NEGATIVE Final   Influenza B by PCR NEGATIVE NEGATIVE Final    Comment: (NOTE) The Xpert Xpress SARS-CoV-2/FLU/RSV plus assay is intended as an aid in the diagnosis of influenza from Nasopharyngeal swab specimens and should not be used as a sole basis for treatment. Nasal washings and aspirates are unacceptable for Xpert Xpress SARS-CoV-2/FLU/RSV testing.  Fact Sheet for Patients: BloggerCourse.com  Fact Sheet for Healthcare Providers: SeriousBroker.it  This test is not yet approved or cleared by the [place] FDA and has been authorized for detection and/or diagnosis of SARS-CoV-2 by FDA under an Emergency Use Authorization (EUA). This EUA will remain in effect (meaning this test can be used) for the duration of the COVID-19 declaration under Section 564(b)(1) of the Act, 21 U.S.C. section 360bbb-3(b)(1), unless the authorization is terminated or revoked.  Performed at [facility], [address] [place], [place] [address]      Radiological Exams on Admission: CT Head Wo Contrast  Result Date: 03/07/2021 CLINICAL DATA:  Larey Seat EXAM: CT HEAD WITHOUT CONTRAST TECHNIQUE: Contiguous axial images were obtained from the base of the skull through the vertex without intravenous contrast. COMPARISON:  12/11/2020 FINDINGS: Brain: Scattered hypodensities throughout the periventricular and subcortical white matter are consistent with chronic small vessel ischemic changes, stable. No acute infarct or hemorrhage. Lateral ventricles and midline structures are stable. No acute extra-axial fluid collections. No mass  effect. Vascular: No hyperdense vessel or unexpected calcification. Skull: Normal. Negative for fracture or focal lesion. Sinuses/Orbits: No acute finding. Other: None. IMPRESSION: 1. Stable exam, no acute intracranial process. Electronically Signed   By: Sharlet Salina M.D.   On: 03/07/2021 18:39   DG Chest Portable 1 View  Result Date: 03/07/2021 CLINICAL DATA:  Tripped and fell, left hip pain EXAM: PORTABLE CHEST 1 VIEW COMPARISON:  None. FINDINGS: Single frontal view of the chest demonstrates an unremarkable cardiac silhouette. Atherosclerosis of the aortic arch. No airspace disease, effusion, or pneumothorax. No acute displaced fractures. The bones are diffusely osteopenic. IMPRESSION: 1. No acute intrathoracic process. Electronically Signed   By: Sharlet Salina M.D.   On: 03/07/2021 18:53   DG HIP UNILAT WITH PELVIS 2-3 VIEWS LEFT  Result Date: 03/07/2021 CLINICAL DATA:  Tripped and fell, left hip pain EXAM: DG HIP (WITH OR WITHOUT PELVIS) 2-3V LEFT COMPARISON:  None. FINDINGS: Frontal view of the pelvis as well as frontal and cross-table lateral views of the  left hip are obtained. There is a comminuted intertrochanteric left hip fracture with mild impaction and varus angulation at the fracture site. No dislocation. The remainder of the bony pelvis is unremarkable. Right hip arthroplasty is identified without complication. IMPRESSION: 1. Comminuted intertrochanteric left hip fracture as above. Electronically Signed   By: Sharlet SalinaMichael  Brown M.D.   On: 03/07/2021 18:54    EKG: Independently reviewed.  Shows sinus bradycardia with a rate of 56 with no significant ST changes.  Assessment/Plan Principal Problem:   left Hip fracture (HCC) Active Problems:   Anxiety and depression   Coronary artery disease involving native coronary artery of native heart without angina pectoris   Essential hypertension   Other insomnia   Pure hypercholesterolemia   Valvular heart disease   Acute lower UTI     #1  left intertrochanteric fracture: Status post mechanical fall.  Patient to be seen by orthopedics.  Due to Plavix patient will not have immediate surgery.  We will hold the Plavix.  Initiate DVT prophylaxis with Lovenox which can be discontinued 12 hours prior to surgery.  Pain control and supportive care.  #2 coronary artery disease: Has been stable inactive.  Holding Plavix otherwise continue other medications.  #3 essential hypertension: Blood pressure is acutely elevated.  Initiate home regimen and add clonidine as needed  #4 hyperlipidemia: Continue with statin.  #5 UTI: Empirically start Rocephin.  Sent urine and blood cultures.  #6 anxiety with depression: Continue monitoring patient.   DVT prophylaxis: Lovenox Code Status: Full code Family Communication: Daughter Disposition Plan: To be determined Consults called: Orthopedic surgery Dr. Odis LusterBowers Admission status: Inpatient  Severity of Illness: The appropriate patient status for this patient is INPATIENT. Inpatient status is judged to be reasonable and necessary in order to provide the required intensity of service to ensure the patient's safety. The patient's presenting symptoms, physical exam findings, and initial radiographic and laboratory data in the context of their chronic comorbidities is felt to place them at high risk for further clinical deterioration. Furthermore, it is not anticipated that the patient will be medically stable for discharge from the hospital within 2 midnights of admission. The following factors support the patient status of inpatient.   " The patient's presenting symptoms include fall with left hip pain. " The worrisome physical exam findings include laterally rotated left hip. " The initial radiographic and laboratory data are worrisome because of left intertrochanteric fracture. " The chronic co-morbidities include hypertension.   * I certify that at the point of admission it is my clinical judgment  that the patient will require inpatient hospital care spanning beyond 2 midnights from the point of admission due to high intensity of service, high risk for further deterioration and high frequency of surveillance required.Lonia Blood*    Christella App,LAWAL MD [facility] Pager [phone]  If 7PM-7AM, please contact night-coverage [email or web address] Password [email or web address]  03/07/2021, 8:28 PM

## 2021-03-07 NOTE — ED Triage Notes (Addendum)
First Nurse Note: Arrives via [facility].  Patient fell at home today.  While walking back from the bedroom, tripped over carpet.  No LOC. C/O left femur pain.  Unable to stand since fall.  VS wnl.  20 g LFA.  50 mcg fentanyl given PTA

## 2021-03-08 DIAGNOSIS — N390 Urinary tract infection, site not specified: Secondary | ICD-10-CM | POA: Diagnosis not present

## 2021-03-08 DIAGNOSIS — F0390 Unspecified dementia without behavioral disturbance: Secondary | ICD-10-CM | POA: Diagnosis not present

## 2021-03-08 DIAGNOSIS — S72002A Fracture of unspecified part of neck of left femur, initial encounter for closed fracture: Secondary | ICD-10-CM | POA: Diagnosis not present

## 2021-03-08 LAB — COMPREHENSIVE METABOLIC PANEL
ALT: 10 U/L (ref 0–44)
AST: 18 U/L (ref 15–41)
Albumin: 3.1 g/dL — ABNORMAL LOW (ref 3.5–5.0)
Alkaline Phosphatase: 48 U/L (ref 38–126)
Anion gap: 5 (ref 5–15)
BUN: 37 mg/dL — ABNORMAL HIGH (ref 8–23)
CO2: 28 mmol/L (ref 22–32)
Calcium: 8.5 mg/dL — ABNORMAL LOW (ref 8.9–10.3)
Chloride: 103 mmol/L (ref 98–111)
Creatinine, Ser: 1.06 mg/dL — ABNORMAL HIGH (ref 0.44–1.00)
GFR, Estimated: 47 mL/min — ABNORMAL LOW (ref >60)
Glucose, Bld: 99 mg/dL (ref 70–99)
Potassium: 4.9 mmol/L (ref 3.5–5.1)
Sodium: 136 mmol/L (ref 135–145)
Total Bilirubin: 0.6 mg/dL (ref 0.3–1.2)
Total Protein: 5.7 g/dL — ABNORMAL LOW (ref 6.5–8.1)

## 2021-03-08 MED ORDER — HYDROCODONE-ACETAMINOPHEN 5-325 MG PO TABS
1.0000 | ORAL_TABLET | Freq: Four times a day (QID) | ORAL | Status: DC | PRN
  Administered 2021-03-08 – 2021-03-15 (×4): 1 via ORAL
  Filled 2021-03-08 (×4): qty 1

## 2021-03-08 MED ORDER — CHLORHEXIDINE GLUCONATE CLOTH 2 % EX PADS
6.0000 | MEDICATED_PAD | Freq: Every day | CUTANEOUS | Status: DC
  Administered 2021-03-08 – 2021-03-18 (×7): 6 via TOPICAL

## 2021-03-08 MED ORDER — MORPHINE SULFATE (PF) 2 MG/ML IV SOLN
1.0000 mg | INTRAVENOUS | Status: DC | PRN
  Administered 2021-03-09 – 2021-03-11 (×4): 1 mg via INTRAVENOUS
  Filled 2021-03-08 (×5): qty 1

## 2021-03-08 MED ORDER — LORAZEPAM 2 MG/ML IJ SOLN
0.5000 mg | Freq: Once | INTRAMUSCULAR | Status: AC
  Administered 2021-03-08: 0.5 mg via INTRAVENOUS
  Filled 2021-03-08: qty 1

## 2021-03-08 MED ORDER — ENOXAPARIN SODIUM 30 MG/0.3ML IJ SOSY
30.0000 mg | PREFILLED_SYRINGE | INTRAMUSCULAR | Status: AC
  Administered 2021-03-08 – 2021-03-10 (×2): 30 mg via SUBCUTANEOUS
  Filled 2021-03-08 (×2): qty 0.3

## 2021-03-08 NOTE — Progress Notes (Signed)
PROGRESS NOTE    Sara Conley  QIO:[id number] DOB: Sep 27, 1918 DOA: 03/07/2021 PCP: Marisue Ivan, MD   Assessment & Plan:   Principal Problem:   left Hip fracture (HCC) Active Problems:   Anxiety and depression   Coronary artery disease involving native coronary artery of native heart without angina pectoris   Essential hypertension   Other insomnia   Pure hypercholesterolemia   Valvular heart disease   Acute lower UTI   Left intertrochanteric fracture: s/p mechanical fall. Continue to hold home dose of plavix. Norco, morphine prn for pain. Ortho surg consulted   HTN: continue on irbesartan, metoprolol   HLD: continue on statin  Likely UTI: UA is positive. Urine cx is pending. Continue on IV ceftriaxone    Hx of CAD: continue on home dose of metoprolol, irbesartan, statin. Continue to hold plavix   Dementia: continue w/ supportive care   DVT prophylaxis: lovenox  Code Status: full  Family Communication: discussed pt's care w/ pt's family at bedside and answered their questions  Disposition Plan: depends on PT/OT recs   Level of care: Med-Surg   Status is: Inpatient  Remains inpatient appropriate because:Ongoing diagnostic testing needed not appropriate for outpatient work up, Unsafe d/c plan, IV treatments appropriate due to intensity of illness or inability to take PO and Inpatient level of care appropriate due to severity of illness   Dispo: The patient is from: Home              Anticipated d/c is to: SNF              Patient currently is not medically stable to d/c.   Difficult to place patient: unclear     Consultants:   Ortho surg  Procedures:    Antimicrobials:   Subjective: Pt denies any hip pain currently   Objective: Vitals:   03/07/21 2030 03/07/21 2236 03/08/21 0337 03/08/21 0815  BP: (!) 175/70 (!) 184/46 (!) 111/37 134/64  Pulse: (!) 53 (!) 53 (!) 54 (!) 58  Resp:  17 16 18   Temp:  98.3 F (36.8 C) 97.7 F (36.5 C) 97.8 F  (36.6 C)  TempSrc:      SpO2: 100% 100% 100% 94%  Weight:      Height:        Intake/Output Summary (Last 24 hours) at 03/08/2021 1045 Last data filed at 03/08/2021 0900 Gross per 24 hour  Intake 503.23 ml  Output 200 ml  Net 303.23 ml   Filed Weights   03/07/21 1717  Weight: 42.6 kg    Examination:  General exam: Appears comfortable. Frail appearing  Respiratory system: Clear to auscultation. Respiratory effort normal. Cardiovascular system: S1 & S2 +. No rubs, gallops or clicks.  Gastrointestinal system: Abdomen is nondistended, soft and nontender. Normal bowel sounds heard. Central nervous system: Alert and awake. Moves all extremities  Psychiatry: Judgement and insight appear abnormal. Flat mood and affect    Data Reviewed: I have personally reviewed following labs and imaging studies  CBC: Recent Labs  Lab 03/07/21 1859  WBC 11.1*  NEUTROABS 8.6*  HGB 10.7*  HCT 32.6*  MCV 97.3  PLT 189   Basic Metabolic Panel: Recent Labs  Lab 03/07/21 2005 03/08/21 0614  NA 135 136  K 4.5 4.9  CL 103 103  CO2 23 28  GLUCOSE 115* 99  BUN 34* 37*  CREATININE 0.82 1.06*  CALCIUM 8.7* 8.5*   GFR: Estimated Creatinine Clearance: 18.2 mL/min (A) (by C-G formula based on SCr  of 1.06 mg/dL (H)). Liver Function Tests: Recent Labs  Lab 03/07/21 2005 03/08/21 0614  AST 23 18  ALT 11 10  ALKPHOS 51 48  BILITOT 0.5 0.6  PROT 6.1* 5.7*  ALBUMIN 3.5 3.1*   No results for input(s): LIPASE, AMYLASE in the last 168 hours. No results for input(s): AMMONIA in the last 168 hours. Coagulation Profile: No results for input(s): INR, PROTIME in the last 168 hours. Cardiac Enzymes: No results for input(s): CKTOTAL, CKMB, CKMBINDEX, TROPONINI in the last 168 hours. BNP (last 3 results) No results for input(s): PROBNP in the last 8760 hours. HbA1C: No results for input(s): HGBA1C in the last 72 hours. CBG: No results for input(s): GLUCAP in the last 168 hours. Lipid  Profile: No results for input(s): CHOL, HDL, LDLCALC, TRIG, CHOLHDL, LDLDIRECT in the last 72 hours. Thyroid Function Tests: No results for input(s): TSH, T4TOTAL, FREET4, T3FREE, THYROIDAB in the last 72 hours. Anemia Panel: No results for input(s): VITAMINB12, FOLATE, FERRITIN, TIBC, IRON, RETICCTPCT in the last 72 hours. Sepsis Labs: No results for input(s): PROCALCITON, LATICACIDVEN in the last 168 hours.  Recent Results (from the past 240 hour(s))  Resp Panel by RT-PCR (Flu A&B, Covid) Nasopharyngeal Swab     Status: None   Collection Time: 03/07/21  6:59 PM   Specimen: Nasopharyngeal Swab; Nasopharyngeal(NP) swabs in vial transport medium  Result Value Ref Range Status   SARS Coronavirus 2 by RT PCR NEGATIVE NEGATIVE Final    Comment: (NOTE) SARS-CoV-2 target nucleic acids are NOT DETECTED.  The SARS-CoV-2 RNA is generally detectable in upper respiratory specimens during the acute phase of infection. The lowest concentration of SARS-CoV-2 viral copies this assay can detect is 138 copies/mL. A negative result does not preclude SARS-Cov-2 infection and should not be used as the sole basis for treatment or other patient management decisions. A negative result may occur with  improper specimen collection/handling, submission of specimen other than nasopharyngeal swab, presence of viral mutation(s) within the areas targeted by this assay, and inadequate number of viral copies(<138 copies/mL). A negative result must be combined with clinical observations, patient history, and epidemiological information. The expected result is Negative.  Fact Sheet for Patients:  BloggerCourse.com  Fact Sheet for Healthcare Providers:  SeriousBroker.it  This test is no t yet approved or cleared by the [place] FDA and  has been authorized for detection and/or diagnosis of SARS-CoV-2 by FDA under an Emergency Use Authorization (EUA). This EUA  will remain  in effect (meaning this test can be used) for the duration of the COVID-19 declaration under Section 564(b)(1) of the Act, 21 U.S.C.section 360bbb-3(b)(1), unless the authorization is terminated  or revoked sooner.       Influenza A by PCR NEGATIVE NEGATIVE Final   Influenza B by PCR NEGATIVE NEGATIVE Final    Comment: (NOTE) The Xpert Xpress SARS-CoV-2/FLU/RSV plus assay is intended as an aid in the diagnosis of influenza from Nasopharyngeal swab specimens and should not be used as a sole basis for treatment. Nasal washings and aspirates are unacceptable for Xpert Xpress SARS-CoV-2/FLU/RSV testing.  Fact Sheet for Patients: BloggerCourse.com  Fact Sheet for Healthcare Providers: SeriousBroker.it  This test is not yet approved or cleared by the [place] FDA and has been authorized for detection and/or diagnosis of SARS-CoV-2 by FDA under an Emergency Use Authorization (EUA). This EUA will remain in effect (meaning this test can be used) for the duration of the COVID-19 declaration under Section 564(b)(1) of the Act, 21  U.S.C. section 360bbb-3(b)(1), unless the authorization is terminated or revoked.  Performed at [facility], [address], [place], [place] [address]          Radiology Studies: CT Head Wo Contrast  Result Date: 03/07/2021 CLINICAL DATA:  Larey Seat EXAM: CT HEAD WITHOUT CONTRAST TECHNIQUE: Contiguous axial images were obtained from the base of the skull through the vertex without intravenous contrast. COMPARISON:  12/11/2020 FINDINGS: Brain: Scattered hypodensities throughout the periventricular and subcortical white matter are consistent with chronic small vessel ischemic changes, stable. No acute infarct or hemorrhage. Lateral ventricles and midline structures are stable. No acute extra-axial fluid collections. No mass effect. Vascular: No hyperdense vessel or unexpected  calcification. Skull: Normal. Negative for fracture or focal lesion. Sinuses/Orbits: No acute finding. Other: None. IMPRESSION: 1. Stable exam, no acute intracranial process. Electronically Signed   By: Sharlet Salina M.D.   On: 03/07/2021 18:39   DG Chest Portable 1 View  Result Date: 03/07/2021 CLINICAL DATA:  Tripped and fell, left hip pain EXAM: PORTABLE CHEST 1 VIEW COMPARISON:  None. FINDINGS: Single frontal view of the chest demonstrates an unremarkable cardiac silhouette. Atherosclerosis of the aortic arch. No airspace disease, effusion, or pneumothorax. No acute displaced fractures. The bones are diffusely osteopenic. IMPRESSION: 1. No acute intrathoracic process. Electronically Signed   By: Sharlet Salina M.D.   On: 03/07/2021 18:53   DG HIP UNILAT WITH PELVIS 2-3 VIEWS LEFT  Result Date: 03/07/2021 CLINICAL DATA:  Tripped and fell, left hip pain EXAM: DG HIP (WITH OR WITHOUT PELVIS) 2-3V LEFT COMPARISON:  None. FINDINGS: Frontal view of the pelvis as well as frontal and cross-table lateral views of the left hip are obtained. There is a comminuted intertrochanteric left hip fracture with mild impaction and varus angulation at the fracture site. No dislocation. The remainder of the bony pelvis is unremarkable. Right hip arthroplasty is identified without complication. IMPRESSION: 1. Comminuted intertrochanteric left hip fracture as above. Electronically Signed   By: Sharlet Salina M.D.   On: 03/07/2021 18:54        Scheduled Meds: . atorvastatin  40 mg Oral QHS  . Chlorhexidine Gluconate Cloth  6 each Topical Daily  . doxepin  10 mg Oral QHS  . enoxaparin (LOVENOX) injection  30 mg Subcutaneous Q24H  . irbesartan  300 mg Oral Daily  . metoprolol succinate  50 mg Oral Daily   Continuous Infusions: . cefTRIAXone (ROCEPHIN)  IV    . lactated ringers 50 mL/hr at 03/08/21 0456     LOS: 1 day    Time spent: 34 mins    Charise Killian, MD [facility] Pager [phone]  [phone]  If 7PM-7AM, please contact night-coverage 03/08/2021, 10:45 AM

## 2021-03-08 NOTE — Progress Notes (Signed)
PHARMACIST - PHYSICIAN COMMUNICATION  CONCERNING:  Enoxaparin (Lovenox) for DVT Prophylaxis    RECOMMENDATION: Patient was prescribed enoxaprin 40mg  q24 hours for VTE prophylaxis.   Filed Weights   03/07/21 1717  Weight: 42.6 kg (94 lb)    Body mass index is 19.65 kg/m.  Estimated Creatinine Clearance: 23.6 mL/min (by C-G formula based on SCr of 0.82 mg/dL).  Patient is candidate for enoxaparin 30mg  every 24 hours based on CrCl <36ml/min or Weight <45kg  DESCRIPTION: Pharmacy has adjusted enoxaparin dose per [facility] policy.  Patient is now receiving enoxaparin 30 mg every 24 hours   31m, PharmD, [facility] 03/08/2021 2:03 AM

## 2021-03-08 NOTE — Consult Note (Signed)
ORTHOPAEDIC CONSULTATION  REQUESTING PHYSICIAN: Charise Killian, MD  Chief Complaint: hip pain  HPI: Sara Conley is a 87 y.o. female who complains of left hip pain. The pain is sharp in character. The pain is severe and 8/10. The pain is worse with movement and better with rest. Denies any numbness, tingling or constitutional symptoms.  Past Medical History:  Diagnosis Date  . Hyperlipidemia   . Hypertension    Past Surgical History:  Procedure Laterality Date  . CORONARY ANGIOPLASTY WITH STENT PLACEMENT     Social History   Socioeconomic History  . Marital status: Widowed    Spouse name: Not on file  . Number of children: Not on file  . Years of education: Not on file  . Highest education level: Not on file  Occupational History  . Not on file  Tobacco Use  . Smoking status: Never Smoker  . Smokeless tobacco: Never Used  Substance and Sexual Activity  . Alcohol use: Yes  . Drug use: Never  . Sexual activity: Not on file  Other Topics Concern  . Not on file  Social History Narrative  . Not on file   Social Determinants of Health   Financial Resource Strain: Not on file  Food Insecurity: Not on file  Transportation Needs: Not on file  Physical Activity: Not on file  Stress: Not on file  Social Connections: Not on file   No family history on file. No Known Allergies Prior to Admission medications   Medication Sig Start Date End Date Taking? Authorizing Provider  atorvastatin (LIPITOR) 40 MG tablet Take 40 mg by mouth at bedtime.    Yes [provider]  cholecalciferol (VITAMIN D3) 25 MCG (1000 UNIT) tablet Take 1,000 Units by mouth daily.   Yes [provider]  clopidogrel (PLAVIX) 75 MG tablet Take 75 mg by mouth daily.   Yes [provider]  escitalopram (LEXAPRO) 10 MG tablet Take 10 mg by mouth at bedtime. 01/03/21  Yes [provider]  irbesartan (AVAPRO) 300 MG tablet Take 300 mg by mouth daily.   Yes [provider]  melatonin 3 MG TABS tablet Take 3 mg by mouth at bedtime.   Yes [provider]  metoprolol succinate (TOPROL-XL) 50 MG 24 hr tablet Take 50 mg by mouth daily. 02/24/20  Yes [provider]  oxyCODONE (ROXICODONE) 5 MG immediate release tablet Take 0.5 tablets (2.5 mg total) by mouth every 8 (eight) hours as needed for severe pain. Patient not taking: No sig reported 12/11/20 12/11/21  Shaune Pollack, MD   CT Head Wo Contrast  Result Date: 03/07/2021 CLINICAL DATA:  Larey Seat EXAM: CT HEAD WITHOUT CONTRAST TECHNIQUE: Contiguous axial images were obtained from the base of the skull through the vertex without intravenous contrast. COMPARISON:  12/11/2020 FINDINGS: Brain: Scattered hypodensities throughout the periventricular and subcortical white matter are consistent with chronic small vessel ischemic changes, stable. No acute infarct or hemorrhage. Lateral ventricles and midline structures are stable. No acute extra-axial fluid collections. No mass effect. Vascular: No hyperdense vessel or unexpected calcification. Skull: Normal. Negative for fracture or focal lesion. Sinuses/Orbits: No acute finding. Other: None. IMPRESSION: 1. Stable exam, no acute intracranial process. Electronically Signed   By: Sharlet Salina M.D.   On: 03/07/2021 18:39   DG Chest Portable 1 View  Result Date: 03/07/2021 CLINICAL DATA:  Tripped and fell, left hip pain EXAM: PORTABLE CHEST 1 VIEW COMPARISON:  None. FINDINGS: Single frontal view of the chest demonstrates  an unremarkable cardiac silhouette. Atherosclerosis of the aortic arch. No airspace disease, effusion, or pneumothorax. No acute displaced fractures. The bones are diffusely osteopenic. IMPRESSION: 1. No acute intrathoracic process. Electronically Signed   By: Sharlet Salina M.D.   On: 03/07/2021 18:53   DG HIP UNILAT WITH PELVIS 2-3 VIEWS LEFT  Result Date: 03/07/2021 CLINICAL DATA:  Tripped and fell, left hip pain EXAM: DG HIP (WITH OR  WITHOUT PELVIS) 2-3V LEFT COMPARISON:  None. FINDINGS: Frontal view of the pelvis as well as frontal and cross-table lateral views of the left hip are obtained. There is a comminuted intertrochanteric left hip fracture with mild impaction and varus angulation at the fracture site. No dislocation. The remainder of the bony pelvis is unremarkable. Right hip arthroplasty is identified without complication. IMPRESSION: 1. Comminuted intertrochanteric left hip fracture as above. Electronically Signed   By: Sharlet Salina M.D.   On: 03/07/2021 18:54    Positive ROS: All other systems have been reviewed and were otherwise negative with the exception of those mentioned in the HPI and as above.  Physical Exam: General: Alert, no acute distress Cardiovascular: No pedal edema Respiratory: No cyanosis, no use of accessory musculature GI: No organomegaly, abdomen is soft and non-tender Skin: No lesions in the area of chief complaint Neurologic: Sensation intact distally Psychiatric: Patient is competent for consent with normal mood and affect Lymphatic: No axillary or cervical lymphadenopathy  MUSCULOSKELETAL: left leg short, externally rotated. Compartments soft. Good cap refill. Motor and sensory intact distally.  Assessment: Left hip intertrochanteric fracture, closed, displaced  Plan: Plan trochanteric femoral nail on Monday with spinal anesthesia.  The diagnosis, risks, benefits and alternatives to treatment are all discussed in detail with the patient and family. Risks include but are not limited to bleeding, infection, deep vein thrombosis, pulmonary embolism, nerve or vascular injury, non-union, repeat operation, persistent pain, weakness, stiffness and death. The daughter serves as interpreter and she understands and is eager to proceed.     Lyndle Herrlich, MD    03/08/2021 4:37 PM

## 2021-03-08 NOTE — Plan of Care (Signed)
  Problem: Education: Goal: Knowledge of General Education information will improve Description: Including pain rating scale, medication(s)/side effects and non-pharmacologic comfort measures Outcome: Progressing   Problem: Health Behavior/Discharge Planning: Goal: Ability to manage health-related needs will improve Outcome: Progressing   Problem: Clinical Measurements: Goal: Ability to maintain clinical measurements within normal limits will improve Outcome: Progressing Goal: Will remain free from infection Outcome: Progressing Goal: Diagnostic test results will improve Outcome: Progressing Goal: Respiratory complications will improve Outcome: Progressing Goal: Cardiovascular complication will be avoided Outcome: Progressing   Problem: Activity: Goal: Risk for activity intolerance will decrease Outcome: Progressing   Problem: Nutrition: Goal: Adequate nutrition will be maintained Outcome: Progressing   Problem: Coping: Goal: Level of anxiety will decrease Outcome: Progressing   Problem: Nutrition: Goal: Adequate nutrition will be maintained Outcome: Progressing   Problem: Coping: Goal: Level of anxiety will decrease Outcome: Progressing   Problem: Elimination: Goal: Will not experience complications related to bowel motility Outcome: Progressing Goal: Will not experience complications related to urinary retention Outcome: Progressing   Problem: Pain Managment: Goal: General experience of comfort will improve Outcome: Progressing   Problem: Safety: Goal: Ability to remain free from injury will improve Outcome: Progressing   Problem: Skin Integrity: Goal: Risk for impaired skin integrity will decrease Outcome: Progressing   Problem: Education: Goal: Verbalization of understanding the information provided (i.e., activity precautions, restrictions, etc) will improve Outcome: Progressing Goal: Individualized Educational Video(s) Outcome: Progressing    Problem: Activity: Goal: Ability to ambulate and perform ADLs will improve Outcome: Progressing   Problem: Clinical Measurements: Goal: Postoperative complications will be avoided or minimized Outcome: Progressing   Problem: Self-Concept: Goal: Ability to maintain and perform role responsibilities to the fullest extent possible will improve Outcome: Progressing   Problem: Pain Management: Goal: Pain level will decrease Outcome: Progressing

## 2021-03-09 DIAGNOSIS — N390 Urinary tract infection, site not specified: Secondary | ICD-10-CM | POA: Diagnosis not present

## 2021-03-09 DIAGNOSIS — S72002A Fracture of unspecified part of neck of left femur, initial encounter for closed fracture: Secondary | ICD-10-CM | POA: Diagnosis not present

## 2021-03-09 DIAGNOSIS — F0390 Unspecified dementia without behavioral disturbance: Secondary | ICD-10-CM | POA: Diagnosis not present

## 2021-03-09 LAB — CBC
HCT: 27.6 % — ABNORMAL LOW (ref 36.0–46.0)
Hemoglobin: 8.7 g/dL — ABNORMAL LOW (ref 12.0–15.0)
MCH: 31.3 pg (ref 26.0–34.0)
MCHC: 31.5 g/dL (ref 30.0–36.0)
MCV: 99.3 fL (ref 80.0–100.0)
Platelets: 131 10*3/uL — ABNORMAL LOW (ref 150–400)
RBC: 2.78 MIL/uL — ABNORMAL LOW (ref 3.87–5.11)
RDW: 15.1 % (ref 11.5–15.5)
WBC: 9.3 10*3/uL (ref 4.0–10.5)
nRBC: 0 % (ref 0.0–0.2)

## 2021-03-09 LAB — BASIC METABOLIC PANEL
Anion gap: 6 (ref 5–15)
BUN: 38 mg/dL — ABNORMAL HIGH (ref 8–23)
CO2: 26 mmol/L (ref 22–32)
Calcium: 8.4 mg/dL — ABNORMAL LOW (ref 8.9–10.3)
Chloride: 104 mmol/L (ref 98–111)
Creatinine, Ser: 0.97 mg/dL (ref 0.44–1.00)
GFR, Estimated: 52 mL/min — ABNORMAL LOW (ref >60)
Glucose, Bld: 109 mg/dL — ABNORMAL HIGH (ref 70–99)
Potassium: 4.5 mmol/L (ref 3.5–5.1)
Sodium: 136 mmol/L (ref 135–145)

## 2021-03-09 MED ORDER — QUETIAPINE FUMARATE 25 MG PO TABS
25.0000 mg | ORAL_TABLET | Freq: Every day | ORAL | Status: DC
  Administered 2021-03-09 – 2021-03-23 (×14): 25 mg via ORAL
  Filled 2021-03-09 (×15): qty 1

## 2021-03-09 MED ORDER — HALOPERIDOL LACTATE 5 MG/ML IJ SOLN
1.0000 mg | Freq: Four times a day (QID) | INTRAMUSCULAR | Status: DC | PRN
  Administered 2021-03-09 – 2021-03-10 (×2): 1 mg via INTRAVENOUS
  Filled 2021-03-09 (×2): qty 1

## 2021-03-09 MED ORDER — ALPRAZOLAM 0.25 MG PO TABS
0.2500 mg | ORAL_TABLET | Freq: Three times a day (TID) | ORAL | Status: DC | PRN
  Administered 2021-03-09 – 2021-03-14 (×4): 0.25 mg via ORAL
  Filled 2021-03-09 (×4): qty 1

## 2021-03-09 NOTE — Progress Notes (Signed)
MD Mayford Knife notified of pt being aggressive verbally , pulling at lines and thinking everyone is trying to kill her, poison her. RN has given pt xanax and pain medication as ordered. New order for haldol entered and given . Daughter at bedside.

## 2021-03-09 NOTE — Progress Notes (Signed)
Pt is aggressive, pulling at lines, iv pulled out. RN and CNA sit at bedside trying to calm pt down. New iv inserted with 3 assist. Pt is grabbing at her left leg and yelling. Morphine given as ordered. Daughter called and notified of pt's behavior. md notified via text page. Rosey Bath rn applied bilateral mits.

## 2021-03-09 NOTE — Progress Notes (Signed)
PROGRESS NOTE    Sara Conley  OJJ:[id number] DOB: 05-15-1918 DOA: 03/07/2021 PCP: Marisue Ivan, MD   Assessment & Plan:   Principal Problem:   left Hip fracture (HCC) Active Problems:   Anxiety and depression   Coronary artery disease involving native coronary artery of native heart without angina pectoris   Essential hypertension   Other insomnia   Pure hypercholesterolemia   Valvular heart disease   Acute lower UTI   Left intertrochanteric fracture: s/p mechanical fall. Continue to hold home dose of plavix. Will go for surg on 03/12/21. Norco, morphine prn for pain.   HTN: continue on BB, ARB  HLD: continue on statin   UTI: urine cx is growing e. coli, sens pending. Continue on IV ceftriaxone   Hx of CAD: continue on home dose of BB, ARB & statin. Continue to hold plavix   Dementia: continue w/ supportive care. Started on seroquel qhs   Thrombocytopenia: etiology unclear. Will continue to monitor    DVT prophylaxis: lovenox  Code Status: full  Family Communication: discussed pt's care w/ pt's family at bedside and answered their questions  Disposition Plan: depends on PT/OT recs (not consulted yet)   Level of care: Med-Surg   Status is: Inpatient  Remains inpatient appropriate because:Ongoing diagnostic testing needed not appropriate for outpatient work up, Unsafe d/c plan, IV treatments appropriate due to intensity of illness or inability to take PO and Inpatient level of care appropriate due to severity of illness   Dispo: The patient is from: Home              Anticipated d/c is to: SNF              Patient currently is not medically stable to d/c.   Difficult to place patient: unclear     Consultants:   Ortho surg  Procedures:    Antimicrobials:   Subjective: Pt is confused  Objective: Vitals:   03/08/21 0815 03/08/21 1528 03/08/21 2048 03/09/21 0809  BP: 134/64 (!) 111/53 (!) 122/44 (!) 149/65  Pulse: (!) 58 61 69 (!) 103  Resp: 18  16 17 16   Temp: 97.8 F (36.6 C) 98.5 F (36.9 C) 98.2 F (36.8 C)   TempSrc:   Oral   SpO2: 94% 100% 100% 100%  Weight:      Height:        Intake/Output Summary (Last 24 hours) at 03/09/2021 1311 Last data filed at 03/09/2021 05/09/2021 Gross per 24 hour  Intake 1677.94 ml  Output 450 ml  Net 1227.94 ml   Filed Weights   03/07/21 1717  Weight: 42.6 kg    Examination:  General exam: Appears calm & comfortable. Frail appearing  Respiratory system: clear breath sounds b/l. No rales  Cardiovascular system: S1/S2+. No rubs or clicks  Gastrointestinal system: Abd is soft, NT, ND & hypoactive bowel sounds  Central nervous system: Alert and awake. Moves all extremities  Psychiatry: Judgement and insight appear abnormal. Flat mood and affect    Data Reviewed: I have personally reviewed following labs and imaging studies  CBC: Recent Labs  Lab 03/07/21 1859 03/09/21 0735  WBC 11.1* 9.3  NEUTROABS 8.6*  --   HGB 10.7* 8.7*  HCT 32.6* 27.6*  MCV 97.3 99.3  PLT 189 131*   Basic Metabolic Panel: Recent Labs  Lab 03/07/21 2005 03/08/21 0614 03/09/21 0735  NA 135 136 136  K 4.5 4.9 4.5  CL 103 103 104  CO2 23 28 26   GLUCOSE  115* 99 109*  BUN 34* 37* 38*  CREATININE 0.82 1.06* 0.97  CALCIUM 8.7* 8.5* 8.4*   GFR: Estimated Creatinine Clearance: 19.9 mL/min (by C-G formula based on SCr of 0.97 mg/dL). Liver Function Tests: Recent Labs  Lab 03/07/21 2005 03/08/21 0614  AST 23 18  ALT 11 10  ALKPHOS 51 48  BILITOT 0.5 0.6  PROT 6.1* 5.7*  ALBUMIN 3.5 3.1*   No results for input(s): LIPASE, AMYLASE in the last 168 hours. No results for input(s): AMMONIA in the last 168 hours. Coagulation Profile: No results for input(s): INR, PROTIME in the last 168 hours. Cardiac Enzymes: No results for input(s): CKTOTAL, CKMB, CKMBINDEX, TROPONINI in the last 168 hours. BNP (last 3 results) No results for input(s): PROBNP in the last 8760 hours. HbA1C: No results for  input(s): HGBA1C in the last 72 hours. CBG: No results for input(s): GLUCAP in the last 168 hours. Lipid Profile: No results for input(s): CHOL, HDL, LDLCALC, TRIG, CHOLHDL, LDLDIRECT in the last 72 hours. Thyroid Function Tests: No results for input(s): TSH, T4TOTAL, FREET4, T3FREE, THYROIDAB in the last 72 hours. Anemia Panel: No results for input(s): VITAMINB12, FOLATE, FERRITIN, TIBC, IRON, RETICCTPCT in the last 72 hours. Sepsis Labs: No results for input(s): PROCALCITON, LATICACIDVEN in the last 168 hours.  Recent Results (from the past 240 hour(s))  Resp Panel by RT-PCR (Flu A&B, Covid) Nasopharyngeal Swab     Status: None   Collection Time: 03/07/21  6:59 PM   Specimen: Nasopharyngeal Swab; Nasopharyngeal(NP) swabs in vial transport medium  Result Value Ref Range Status   SARS Coronavirus 2 by RT PCR NEGATIVE NEGATIVE Final    Comment: (NOTE) SARS-CoV-2 target nucleic acids are NOT DETECTED.  The SARS-CoV-2 RNA is generally detectable in upper respiratory specimens during the acute phase of infection. The lowest concentration of SARS-CoV-2 viral copies this assay can detect is 138 copies/mL. A negative result does not preclude SARS-Cov-2 infection and should not be used as the sole basis for treatment or other patient management decisions. A negative result may occur with  improper specimen collection/handling, submission of specimen other than nasopharyngeal swab, presence of viral mutation(s) within the areas targeted by this assay, and inadequate number of viral copies(<138 copies/mL). A negative result must be combined with clinical observations, patient history, and epidemiological information. The expected result is Negative.  Fact Sheet for Patients:  BloggerCourse.com  Fact Sheet for Healthcare Providers:  SeriousBroker.it  This test is no t yet approved or cleared by the [place] FDA and  has been  authorized for detection and/or diagnosis of SARS-CoV-2 by FDA under an Emergency Use Authorization (EUA). This EUA will remain  in effect (meaning this test can be used) for the duration of the COVID-19 declaration under Section 564(b)(1) of the Act, 21 U.S.C.section 360bbb-3(b)(1), unless the authorization is terminated  or revoked sooner.       Influenza A by PCR NEGATIVE NEGATIVE Final   Influenza B by PCR NEGATIVE NEGATIVE Final    Comment: (NOTE) The Xpert Xpress SARS-CoV-2/FLU/RSV plus assay is intended as an aid in the diagnosis of influenza from Nasopharyngeal swab specimens and should not be used as a sole basis for treatment. Nasal washings and aspirates are unacceptable for Xpert Xpress SARS-CoV-2/FLU/RSV testing.  Fact Sheet for Patients: BloggerCourse.com  Fact Sheet for Healthcare Providers: SeriousBroker.it  This test is not yet approved or cleared by the [place] FDA and has been authorized for detection and/or diagnosis of SARS-CoV-2 by FDA under  an Emergency Use Authorization (EUA). This EUA will remain in effect (meaning this test can be used) for the duration of the COVID-19 declaration under Section 564(b)(1) of the Act, 21 U.S.C. section 360bbb-3(b)(1), unless the authorization is terminated or revoked.  Performed at [facility], [address] [place], [place] [address]   Urine culture     Status: Abnormal (Preliminary result)   Collection Time: 03/07/21  6:59 PM   Specimen: Urine, Random  Result Value Ref Range Status   Specimen Description   Final    URINE, RANDOM Performed at [facility], [address] [place], [place] [address]    Special Requests   Final    NONE Performed at [facility], [address] [place], [place] [address]    Culture (A)  Final    >=100,000 COLONIES/mL ESCHERICHIA COLI SUSCEPTIBILITIES TO FOLLOW Performed at [facility], [address] [place], [place] [address]    Report Status PENDING  Incomplete         Radiology Studies: CT Head Wo Contrast  Result Date: 03/07/2021 CLINICAL DATA:  Larey SeatFell EXAM: CT HEAD WITHOUT CONTRAST TECHNIQUE: Contiguous axial images were obtained from the base of the skull through the vertex without intravenous contrast. COMPARISON:  12/11/2020 FINDINGS: Brain: Scattered hypodensities throughout the periventricular and subcortical white matter are consistent with chronic small vessel ischemic changes, stable. No acute infarct or hemorrhage. Lateral ventricles and midline structures are stable. No acute extra-axial fluid collections. No mass effect. Vascular: No hyperdense vessel or unexpected calcification. Skull: Normal. Negative for fracture or focal lesion. Sinuses/Orbits: No acute finding. Other: None. IMPRESSION: 1. Stable exam, no acute intracranial process. Electronically Signed   By: Sharlet SalinaMichael  Brown M.D.   On: 03/07/2021 18:39   DG Chest Portable 1 View  Result Date: 03/07/2021 CLINICAL DATA:  Tripped and fell, left hip pain EXAM: PORTABLE CHEST 1 VIEW COMPARISON:  None. FINDINGS: Single frontal view of the chest demonstrates an unremarkable cardiac silhouette. Atherosclerosis of the aortic arch. No airspace disease, effusion, or pneumothorax. No acute displaced fractures. The bones are diffusely osteopenic. IMPRESSION: 1. No acute intrathoracic process. Electronically Signed   By: Sharlet SalinaMichael  Brown M.D.   On: 03/07/2021 18:53   DG HIP UNILAT WITH PELVIS 2-3 VIEWS LEFT  Result Date: 03/07/2021 CLINICAL DATA:  Tripped and fell, left hip pain EXAM: DG HIP (WITH OR WITHOUT PELVIS) 2-3V LEFT COMPARISON:  None. FINDINGS: Frontal view of the pelvis as well as frontal and cross-table lateral views of the left hip are obtained. There is a comminuted intertrochanteric left hip fracture with mild impaction and varus angulation at the fracture site. No dislocation. The remainder of the bony  pelvis is unremarkable. Right hip arthroplasty is identified without complication. IMPRESSION: 1. Comminuted intertrochanteric left hip fracture as above. Electronically Signed   By: Sharlet SalinaMichael  Brown M.D.   On: 03/07/2021 18:54        Scheduled Meds: . atorvastatin  40 mg Oral QHS  . Chlorhexidine Gluconate Cloth  6 each Topical Daily  . doxepin  10 mg Oral QHS  . enoxaparin (LOVENOX) injection  30 mg Subcutaneous Q24H  . irbesartan  300 mg Oral Daily  . metoprolol succinate  50 mg Oral Daily  . QUEtiapine  25 mg Oral QHS   Continuous Infusions: . cefTRIAXone (ROCEPHIN)  IV 1 g (03/08/21 2248)  . lactated ringers 50 mL/hr at 03/08/21 1834     LOS: 2 days    Time spent:  36 mins    Charise Killian, MD [facility] Pager [phone]  If 7PM-7AM, please contact night-coverage 03/09/2021, 1:11 PM

## 2021-03-10 DIAGNOSIS — F0390 Unspecified dementia without behavioral disturbance: Secondary | ICD-10-CM | POA: Diagnosis not present

## 2021-03-10 DIAGNOSIS — N390 Urinary tract infection, site not specified: Secondary | ICD-10-CM | POA: Diagnosis not present

## 2021-03-10 DIAGNOSIS — S72002A Fracture of unspecified part of neck of left femur, initial encounter for closed fracture: Secondary | ICD-10-CM | POA: Diagnosis not present

## 2021-03-10 LAB — BASIC METABOLIC PANEL
Anion gap: 5 (ref 5–15)
BUN: 29 mg/dL — ABNORMAL HIGH (ref 8–23)
CO2: 27 mmol/L (ref 22–32)
Calcium: 8.1 mg/dL — ABNORMAL LOW (ref 8.9–10.3)
Chloride: 101 mmol/L (ref 98–111)
Creatinine, Ser: 0.73 mg/dL (ref 0.44–1.00)
GFR, Estimated: >60 mL/min (ref >60)
Glucose, Bld: 83 mg/dL (ref 70–99)
Potassium: 4.3 mmol/L (ref 3.5–5.1)
Sodium: 133 mmol/L — ABNORMAL LOW (ref 135–145)

## 2021-03-10 LAB — CBC
HCT: 25.1 % — ABNORMAL LOW (ref 36.0–46.0)
Hemoglobin: 7.9 g/dL — ABNORMAL LOW (ref 12.0–15.0)
MCH: 31.6 pg (ref 26.0–34.0)
MCHC: 31.5 g/dL (ref 30.0–36.0)
MCV: 100.4 fL — ABNORMAL HIGH (ref 80.0–100.0)
Platelets: 113 10*3/uL — ABNORMAL LOW (ref 150–400)
RBC: 2.5 MIL/uL — ABNORMAL LOW (ref 3.87–5.11)
RDW: 15 % (ref 11.5–15.5)
WBC: 8 10*3/uL (ref 4.0–10.5)
nRBC: 0 % (ref 0.0–0.2)

## 2021-03-10 LAB — URINE CULTURE: Culture: >=100000 — AB

## 2021-03-10 MED ORDER — LORAZEPAM 0.5 MG PO TABS
0.5000 mg | ORAL_TABLET | ORAL | Status: DC | PRN
  Administered 2021-03-13: 0.5 mg via ORAL
  Filled 2021-03-10: qty 1

## 2021-03-10 MED ORDER — LORAZEPAM 2 MG/ML IJ SOLN
INTRAMUSCULAR | Status: AC
  Administered 2021-03-10: 0.5 mg via INTRAVENOUS
  Filled 2021-03-10: qty 1

## 2021-03-10 MED ORDER — LORAZEPAM 2 MG/ML IJ SOLN
0.5000 mg | INTRAMUSCULAR | Status: DC | PRN
  Administered 2021-03-11 – 2021-03-14 (×4): 0.5 mg via INTRAVENOUS
  Filled 2021-03-10 (×5): qty 1

## 2021-03-10 NOTE — Progress Notes (Signed)
Pt is yelling out, paranoid delusions. Family is asking for medication to calm her down. RN educated family on allprn medications and time they are avaliable. MD williams notified and states we can give seroquel at 2000, no earlier.

## 2021-03-10 NOTE — Progress Notes (Signed)
Subjective:   Procedure(s) (LRB): ANTERIOR APPROACH HEMI HIP ARTHROPLASTY (Left) The patient is 2 days post left subcapital hip fracture.  She is awaiting surgery on Monday due to anticoagulation issues.  She is sitting up and eating lunch today.  Lab work is stable.  Hemoglobin 7.9 today.  Patient reports pain as mild.  Objective:   VITALS:   Vitals:   03/10/21 0553 03/10/21 0915  BP: (!) 132/36 115/60  Pulse: (!) 51 72  Resp: 14 15  Temp: (!) 97.4 F (36.3 C) 97.9 F (36.6 C)  SpO2: 100% 100%    Neurologically intact  LABS Recent Labs    03/07/21 1859 03/09/21 0735 03/10/21 0812  HGB 10.7* 8.7* 7.9*  HCT 32.6* 27.6* 25.1*  WBC 11.1* 9.3 8.0  PLT 189 131* 113*    Recent Labs    03/08/21 0614 03/09/21 0735 03/10/21 0812  NA 136 136 133*  K 4.9 4.5 4.3  BUN 37* 38* 29*  CREATININE 1.06* 0.97 0.73  GLUCOSE 99 109* 83    No results for input(s): LABPT, INR in the last 72 hours.   Assessment/Plan:   Procedure(s) (LRB): ANTERIOR APPROACH HEMI HIP ARTHROPLASTY (Left)   Advance diet Discharge to SNF

## 2021-03-10 NOTE — Progress Notes (Signed)
LR infused today at 50 ml, intake of 500

## 2021-03-10 NOTE — Progress Notes (Signed)
PROGRESS NOTE    Sara Conley  XBL:[id number] DOB: 1917-12-30 DOA: 03/07/2021 PCP: Marisue Ivan, MD   Assessment & Plan:   Principal Problem:   left Hip fracture (HCC) Active Problems:   Anxiety and depression   Coronary artery disease involving native coronary artery of native heart without angina pectoris   Essential hypertension   Other insomnia   Pure hypercholesterolemia   Valvular heart disease   Acute lower UTI   Left intertrochanteric fracture: s/p mechanical fall. Continue to hold home dose of plavix. Will go for surg 03/12/21. Norco, morphine prn for pain   HTN: continue on metoprolol, irbesartan  HLD: continue on statin   UTI: urine cx is growing e. coli. Continue on IV ceftriaxone   Hx of CAD: continue on home dose of metoprolol, irbesartan, & statin. Continue to hold plavix   Dementia: continue w/ supportive care. Continue on seroquel qhs   Thrombocytopenia: etiology unclear. Will continue to monitor   Macrocytic anemia: will check B12 & folate levels. No need for a transfusion currently    DVT prophylaxis: lovenox  Code Status: full  Family Communication:  Disposition Plan: depends on PT/OT recs (not consulted yet)   Level of care: Med-Surg   Status is: Inpatient  Remains inpatient appropriate because:Ongoing diagnostic testing needed not appropriate for outpatient work up, Unsafe d/c plan, IV treatments appropriate due to intensity of illness or inability to take PO and Inpatient level of care appropriate due to severity of illness   Dispo: The patient is from: Home              Anticipated d/c is to: SNF              Patient currently is not medically stable to d/c.   Difficult to place patient: unclear     Consultants:   Ortho surg  Procedures:    Antimicrobials:   Subjective: Pt is pleasantly confused currently.   Objective: Vitals:   03/09/21 1609 03/09/21 2111 03/10/21 0553 03/10/21 0915  BP: (!) 129/58 (!) 136/54 (!)  132/36 115/60  Pulse: 88 82 (!) 51 72  Resp: 16 16 14 15   Temp: 98 F (36.7 C) 98.6 F (37 C) (!) 97.4 F (36.3 C) 97.9 F (36.6 C)  TempSrc: Oral Oral Oral   SpO2: 95% 100% 100% 100%  Weight:      Height:        Intake/Output Summary (Last 24 hours) at 03/10/2021 1240 Last data filed at 03/10/2021 0557 Gross per 24 hour  Intake 685.3 ml  Output 450 ml  Net 235.3 ml   Filed Weights   03/07/21 1717  Weight: 42.6 kg    Examination:  General exam: Appears comfortable. Frail appearing  Respiratory system: clear breath sounds b/l.  Cardiovascular system: S1 & S2+. No rubs or clicks  Gastrointestinal system: Abd is soft, NT, ND & hypoactive bowel sounds  Central nervous system: Lethargic. Moves all extremities  Psychiatry: judgement and insight appear abnormal.     Data Reviewed: I have personally reviewed following labs and imaging studies  CBC: Recent Labs  Lab 03/07/21 1859 03/09/21 0735 03/10/21 0812  WBC 11.1* 9.3 8.0  NEUTROABS 8.6*  --   --   HGB 10.7* 8.7* 7.9*  HCT 32.6* 27.6* 25.1*  MCV 97.3 99.3 100.4*  PLT 189 131* 113*   Basic Metabolic Panel: Recent Labs  Lab 03/07/21 2005 03/08/21 0614 03/09/21 0735 03/10/21 0812  NA 135 136 136 133*  K 4.5 4.9  4.5 4.3  CL 103 103 104 101  CO2 23 28 26 27   GLUCOSE 115* 99 109* 83  BUN 34* 37* 38* 29*  CREATININE 0.82 1.06* 0.97 0.73  CALCIUM 8.7* 8.5* 8.4* 8.1*   GFR: Estimated Creatinine Clearance: 24.1 mL/min (by C-G formula based on SCr of 0.73 mg/dL). Liver Function Tests: Recent Labs  Lab 03/07/21 2005 03/08/21 0614  AST 23 18  ALT 11 10  ALKPHOS 51 48  BILITOT 0.5 0.6  PROT 6.1* 5.7*  ALBUMIN 3.5 3.1*   No results for input(s): LIPASE, AMYLASE in the last 168 hours. No results for input(s): AMMONIA in the last 168 hours. Coagulation Profile: No results for input(s): INR, PROTIME in the last 168 hours. Cardiac Enzymes: No results for input(s): CKTOTAL, CKMB, CKMBINDEX, TROPONINI in the  last 168 hours. BNP (last 3 results) No results for input(s): PROBNP in the last 8760 hours. HbA1C: No results for input(s): HGBA1C in the last 72 hours. CBG: No results for input(s): GLUCAP in the last 168 hours. Lipid Profile: No results for input(s): CHOL, HDL, LDLCALC, TRIG, CHOLHDL, LDLDIRECT in the last 72 hours. Thyroid Function Tests: No results for input(s): TSH, T4TOTAL, FREET4, T3FREE, THYROIDAB in the last 72 hours. Anemia Panel: No results for input(s): VITAMINB12, FOLATE, FERRITIN, TIBC, IRON, RETICCTPCT in the last 72 hours. Sepsis Labs: No results for input(s): PROCALCITON, LATICACIDVEN in the last 168 hours.  Recent Results (from the past 240 hour(s))  Resp Panel by RT-PCR (Flu A&B, Covid) Nasopharyngeal Swab     Status: None   Collection Time: 03/07/21  6:59 PM   Specimen: Nasopharyngeal Swab; Nasopharyngeal(NP) swabs in vial transport medium  Result Value Ref Range Status   SARS Coronavirus 2 by RT PCR NEGATIVE NEGATIVE Final    Comment: (NOTE) SARS-CoV-2 target nucleic acids are NOT DETECTED.  The SARS-CoV-2 RNA is generally detectable in upper respiratory specimens during the acute phase of infection. The lowest concentration of SARS-CoV-2 viral copies this assay can detect is 138 copies/mL. A negative result does not preclude SARS-Cov-2 infection and should not be used as the sole basis for treatment or other patient management decisions. A negative result may occur with  improper specimen collection/handling, submission of specimen other than nasopharyngeal swab, presence of viral mutation(s) within the areas targeted by this assay, and inadequate number of viral copies(<138 copies/mL). A negative result must be combined with clinical observations, patient history, and epidemiological information. The expected result is Negative.  Fact Sheet for Patients:  05/07/21  Fact Sheet for Healthcare Providers:   BloggerCourse.com  This test is no t yet approved or cleared by the [place] FDA and  has been authorized for detection and/or diagnosis of SARS-CoV-2 by FDA under an Emergency Use Authorization (EUA). This EUA will remain  in effect (meaning this test can be used) for the duration of the COVID-19 declaration under Section 564(b)(1) of the Act, 21 U.S.C.section 360bbb-3(b)(1), unless the authorization is terminated  or revoked sooner.       Influenza A by PCR NEGATIVE NEGATIVE Final   Influenza B by PCR NEGATIVE NEGATIVE Final    Comment: (NOTE) The Xpert Xpress SARS-CoV-2/FLU/RSV plus assay is intended as an aid in the diagnosis of influenza from Nasopharyngeal swab specimens and should not be used as a sole basis for treatment. Nasal washings and aspirates are unacceptable for Xpert Xpress SARS-CoV-2/FLU/RSV testing.  Fact Sheet for Patients: [place]  Fact Sheet for Healthcare Providers: BloggerCourse.com  This test is not yet approved  or cleared by the [place] and has been authorized for detection and/or diagnosis of SARS-CoV-2 by FDA under an Emergency Use Authorization (EUA). This EUA will remain in effect (meaning this test can be used) for the duration of the COVID-19 declaration under Section 564(b)(1) of the Act, 21 U.S.C. section 360bbb-3(b)(1), unless the authorization is terminated or revoked.  Performed at [facility], [address] [place], [place] [address]   Urine culture     Status: Abnormal   Collection Time: 03/07/21  6:59 PM   Specimen: Urine, Random  Result Value Ref Range Status   Specimen Description   Final    URINE, RANDOM Performed at [facility], [address] [place], [place] [address]    Special Requests   Final    NONE Performed at [facility], [address] [place], [place] [address]    Culture  >=100,000 COLONIES/mL ESCHERICHIA COLI (A)  Final   Report Status 03/10/2021 FINAL  Final   Organism ID, Bacteria ESCHERICHIA COLI (A)  Final      Susceptibility   Escherichia coli - MIC*    AMPICILLIN <=2 SENSITIVE Sensitive     CEFAZOLIN <=4 SENSITIVE Sensitive     CEFEPIME <=0.12 SENSITIVE Sensitive     CEFTRIAXONE <=0.25 SENSITIVE Sensitive     CIPROFLOXACIN <=0.25 SENSITIVE Sensitive     GENTAMICIN <=1 SENSITIVE Sensitive     IMIPENEM <=0.25 SENSITIVE Sensitive     NITROFURANTOIN <=16 SENSITIVE Sensitive     TRIMETH/SULFA <=20 SENSITIVE Sensitive     AMPICILLIN/SULBACTAM <=2 SENSITIVE Sensitive     PIP/TAZO <=4 SENSITIVE Sensitive     * >=100,000 COLONIES/mL ESCHERICHIA COLI         Radiology Studies: No results found.      Scheduled Meds: . atorvastatin  40 mg Oral QHS  . Chlorhexidine Gluconate Cloth  6 each Topical Daily  . doxepin  10 mg Oral QHS  . enoxaparin (LOVENOX) injection  30 mg Subcutaneous Q24H  . irbesartan  300 mg Oral Daily  . metoprolol succinate  50 mg Oral Daily  . QUEtiapine  25 mg Oral QHS   Continuous Infusions: . cefTRIAXone (ROCEPHIN)  IV Stopped (03/10/21 0603)  . lactated ringers 50 mL/hr at 03/09/21 1746     LOS: 3 days    Time spent: 30 mins    Charise Killian, MD [facility] Pager [phone]  If 7PM-7AM, please contact night-coverage 03/10/2021, 12:40 PM

## 2021-03-10 NOTE — Plan of Care (Signed)
  Problem: Education: Goal: Knowledge of General Education information will improve Description: Including pain rating scale, medication(s)/side effects and non-pharmacologic comfort measures Outcome: Progressing   Problem: Clinical Measurements: Goal: Will remain free from infection Outcome: Progressing   Problem: Clinical Measurements: Goal: Respiratory complications will improve Outcome: Progressing   Problem: Clinical Measurements: Goal: Cardiovascular complication will be avoided Outcome: Progressing   Problem: Coping: Goal: Level of anxiety will decrease Outcome: Progressing   Problem: Elimination: Goal: Will not experience complications related to bowel motility Outcome: Progressing   Problem: Pain Managment: Goal: General experience of comfort will improve Outcome: Progressing   Problem: Safety: Goal: Ability to remain free from injury will improve Outcome: Progressing   Problem: Skin Integrity: Goal: Risk for impaired skin integrity will decrease Outcome: Progressing   Problem: Self-Concept: Goal: Ability to maintain and perform role responsibilities to the fullest extent possible will improve Outcome: Progressing

## 2021-03-11 DIAGNOSIS — F0391 Unspecified dementia with behavioral disturbance: Secondary | ICD-10-CM

## 2021-03-11 DIAGNOSIS — N390 Urinary tract infection, site not specified: Secondary | ICD-10-CM | POA: Diagnosis not present

## 2021-03-11 DIAGNOSIS — S72002A Fracture of unspecified part of neck of left femur, initial encounter for closed fracture: Secondary | ICD-10-CM | POA: Diagnosis not present

## 2021-03-11 LAB — CBC
HCT: 25.1 % — ABNORMAL LOW (ref 36.0–46.0)
Hemoglobin: 8.2 g/dL — ABNORMAL LOW (ref 12.0–15.0)
MCH: 31.9 pg (ref 26.0–34.0)
MCHC: 32.7 g/dL (ref 30.0–36.0)
MCV: 97.7 fL (ref 80.0–100.0)
Platelets: 129 10*3/uL — ABNORMAL LOW (ref 150–400)
RBC: 2.57 MIL/uL — ABNORMAL LOW (ref 3.87–5.11)
RDW: 14.6 % (ref 11.5–15.5)
WBC: 12 10*3/uL — ABNORMAL HIGH (ref 4.0–10.5)
nRBC: 0 % (ref 0.0–0.2)

## 2021-03-11 LAB — VITAMIN B12: Vitamin B-12: 720 pg/mL (ref 180–914)

## 2021-03-11 LAB — BASIC METABOLIC PANEL
Anion gap: 5 (ref 5–15)
BUN: 18 mg/dL (ref 8–23)
CO2: 27 mmol/L (ref 22–32)
Calcium: 8.2 mg/dL — ABNORMAL LOW (ref 8.9–10.3)
Chloride: 101 mmol/L (ref 98–111)
Creatinine, Ser: 0.59 mg/dL (ref 0.44–1.00)
GFR, Estimated: >60 mL/min (ref >60)
Glucose, Bld: 107 mg/dL — ABNORMAL HIGH (ref 70–99)
Potassium: 4 mmol/L (ref 3.5–5.1)
Sodium: 133 mmol/L — ABNORMAL LOW (ref 135–145)

## 2021-03-11 LAB — FOLATE: Folate: 13.2 ng/mL (ref >5.9)

## 2021-03-11 NOTE — Progress Notes (Signed)
Subjective:   Procedure(s) (LRB): ANTERIOR APPROACH HEMI HIP ARTHROPLASTY (Left) Patient is being prepared for surgery tomorrow.  Lovenox has been stopped.  Hemoglobin is stable.  Her daughter is in the room and surgery was discussed.  The operative leg was Marked. Patient reports pain as mild.  Objective:   VITALS:   Vitals:   03/11/21 0449 03/11/21 0728  BP: 119/66 (!) 105/55  Pulse: (!) 106 96  Resp: 17 18  Temp: 97.9 F (36.6 C) 98 F (36.7 C)  SpO2: 95% 100%    Neurologically intact Dorsiflexion/Plantar flexion intact  LABS Recent Labs    03/09/21 0735 03/10/21 0812 03/11/21 0509  HGB 8.7* 7.9* 8.2*  HCT 27.6* 25.1* 25.1*  WBC 9.3 8.0 12.0*  PLT 131* 113* 129*    Recent Labs    03/09/21 0735 03/10/21 0812 03/11/21 0509  NA 136 133* 133*  K 4.5 4.3 4.0  BUN 38* 29* 18  CREATININE 0.97 0.73 0.59  GLUCOSE 109* 83 107*    No results for input(s): LABPT, INR in the last 72 hours.   Assessment/Plan:   Procedure(s) (LRB): ANTERIOR APPROACH HEMI HIP ARTHROPLASTY (Left)   Discharge to SNF after recovering from surgery.

## 2021-03-11 NOTE — Progress Notes (Signed)
PROGRESS NOTE    Sara Conley  IRC:[id number] DOB: Jul 07, 1918 DOA: 03/07/2021 PCP: Marisue Ivan, MD   Assessment & Plan:   Principal Problem:   left Hip fracture (HCC) Active Problems:   Anxiety and depression   Coronary artery disease involving native coronary artery of native heart without angina pectoris   Essential hypertension   Other insomnia   Pure hypercholesterolemia   Valvular heart disease   Acute lower UTI   Left intertrochanteric fracture: s/p mechanical fall. Continue to hold home dose of plavix. Will go for surg 03/12/21. Norco, morphine prn for pain   HTN: continue on ARB, BB  HLD: continue on statin   UTI: urine cx growing e.coli. Continue on IV ceftriaxone   Hx of CAD: continue on home dose of metoprolol, irbesartan, & statin. Continue to hold plavix   Dementia: continue w/ supportive care. Continue on seroquel qhs   Thrombocytopenia: etiology unclear, trending up. Will continue to monitor   Macrocytic anemia: folate is WNL, B12 is pending. H&H are labile.  Leukocytosis: possibly secondary to infection vs reactive. Will continue to monitor    DVT prophylaxis: lovenox  Code Status: full  Family Communication: discussed pt's care w/ pt's daughter at bedside and answered her questions  Disposition Plan: depends on PT/OT recs (not consulted yet)   Level of care: Med-Surg   Status is: Inpatient  Remains inpatient appropriate because:Ongoing diagnostic testing needed not appropriate for outpatient work up, Unsafe d/c plan, IV treatments appropriate due to intensity of illness or inability to take PO and Inpatient level of care appropriate due to severity of illness   Dispo: The patient is from: Home              Anticipated d/c is to: SNF              Patient currently is not medically stable to d/c.   Difficult to place patient: unclear     Consultants:   Ortho surg  Procedures:    Antimicrobials:   Subjective: Pt is resting  comfortably. Pt was not answering questions  Objective: Vitals:   03/10/21 2120 03/11/21 0005 03/11/21 0449 03/11/21 0728  BP: (!) 162/74 118/71 119/66 (!) 105/55  Pulse: (!) 110 (!) 108 (!) 106 96  Resp: 17 17 17 18   Temp: 99.1 F (37.3 C) 98 F (36.7 C) 97.9 F (36.6 C) 98 F (36.7 C)  TempSrc: Oral     SpO2: 98% 95% 95% 100%  Weight:      Height:        Intake/Output Summary (Last 24 hours) at 03/11/2021 0744 Last data filed at 03/11/2021 0454 Gross per 24 hour  Intake 240 ml  Output 725 ml  Net -485 ml   Filed Weights   03/07/21 1717  Weight: 42.6 kg    Examination:  General exam: Appears calm & comfortable. Frail appearing  Respiratory system: clear breath sounds b/l   Cardiovascular system: S1/S2+. No rubs or gallops Gastrointestinal system: Abd is soft, NT, ND & hypoactive bowel sounds  Central nervous system: Lethargic. Moves all extremities  Psychiatry: judgement and insight appear abnormal     Data Reviewed: I have personally reviewed following labs and imaging studies  CBC: Recent Labs  Lab 03/07/21 1859 03/09/21 0735 03/10/21 0812 03/11/21 0509  WBC 11.1* 9.3 8.0 12.0*  NEUTROABS 8.6*  --   --   --   HGB 10.7* 8.7* 7.9* 8.2*  HCT 32.6* 27.6* 25.1* 25.1*  MCV 97.3 99.3 100.4*  97.7  PLT 189 131* 113* 129*   Basic Metabolic Panel: Recent Labs  Lab 03/07/21 2005 03/08/21 0614 03/09/21 0735 03/10/21 0812 03/11/21 0509  NA 135 136 136 133* 133*  K 4.5 4.9 4.5 4.3 4.0  CL 103 103 104 101 101  CO2 23 28 26 27 27   GLUCOSE 115* 99 109* 83 107*  BUN 34* 37* 38* 29* 18  CREATININE 0.82 1.06* 0.97 0.73 0.59  CALCIUM 8.7* 8.5* 8.4* 8.1* 8.2*   GFR: Estimated Creatinine Clearance: 24.1 mL/min (by C-G formula based on SCr of 0.59 mg/dL). Liver Function Tests: Recent Labs  Lab 03/07/21 2005 03/08/21 0614  AST 23 18  ALT 11 10  ALKPHOS 51 48  BILITOT 0.5 0.6  PROT 6.1* 5.7*  ALBUMIN 3.5 3.1*   No results for input(s): LIPASE, AMYLASE in  the last 168 hours. No results for input(s): AMMONIA in the last 168 hours. Coagulation Profile: No results for input(s): INR, PROTIME in the last 168 hours. Cardiac Enzymes: No results for input(s): CKTOTAL, CKMB, CKMBINDEX, TROPONINI in the last 168 hours. BNP (last 3 results) No results for input(s): PROBNP in the last 8760 hours. HbA1C: No results for input(s): HGBA1C in the last 72 hours. CBG: No results for input(s): GLUCAP in the last 168 hours. Lipid Profile: No results for input(s): CHOL, HDL, LDLCALC, TRIG, CHOLHDL, LDLDIRECT in the last 72 hours. Thyroid Function Tests: No results for input(s): TSH, T4TOTAL, FREET4, T3FREE, THYROIDAB in the last 72 hours. Anemia Panel: Recent Labs    03/11/21 0509  FOLATE 13.2   Sepsis Labs: No results for input(s): PROCALCITON, LATICACIDVEN in the last 168 hours.  Recent Results (from the past 240 hour(s))  Resp Panel by RT-PCR (Flu A&B, Covid) Nasopharyngeal Swab     Status: None   Collection Time: 03/07/21  6:59 PM   Specimen: Nasopharyngeal Swab; Nasopharyngeal(NP) swabs in vial transport medium  Result Value Ref Range Status   SARS Coronavirus 2 by RT PCR NEGATIVE NEGATIVE Final    Comment: (NOTE) SARS-CoV-2 target nucleic acids are NOT DETECTED.  The SARS-CoV-2 RNA is generally detectable in upper respiratory specimens during the acute phase of infection. The lowest concentration of SARS-CoV-2 viral copies this assay can detect is 138 copies/mL. A negative result does not preclude SARS-Cov-2 infection and should not be used as the sole basis for treatment or other patient management decisions. A negative result may occur with  improper specimen collection/handling, submission of specimen other than nasopharyngeal swab, presence of viral mutation(s) within the areas targeted by this assay, and inadequate number of viral copies(<138 copies/mL). A negative result must be combined with clinical observations, patient history,  and epidemiological information. The expected result is Negative.  Fact Sheet for Patients:  05/07/21  Fact Sheet for Healthcare Providers:  BloggerCourse.com  This test is no t yet approved or cleared by the [employer] FDA and  has been authorized for detection and/or diagnosis of SARS-CoV-2 by FDA under an Emergency Use Authorization (EUA). This EUA will remain  in effect (meaning this test can be used) for the duration of the COVID-19 declaration under Section 564(b)(1) of the Act, 21 U.S.C.section 360bbb-3(b)(1), unless the authorization is terminated  or revoked sooner.       Influenza A by PCR NEGATIVE NEGATIVE Final   Influenza B by PCR NEGATIVE NEGATIVE Final    Comment: (NOTE) The Xpert Xpress SARS-CoV-2/FLU/RSV plus assay is intended as an aid in the diagnosis of influenza from Nasopharyngeal swab specimens and  should not be used as a sole basis for treatment. Nasal washings and aspirates are unacceptable for Xpert Xpress SARS-CoV-2/FLU/RSV testing.  Fact Sheet for Patients: BloggerCourse.com  Fact Sheet for Healthcare Providers: SeriousBroker.it  This test is not yet approved or cleared by the [place] FDA and has been authorized for detection and/or diagnosis of SARS-CoV-2 by FDA under an Emergency Use Authorization (EUA). This EUA will remain in effect (meaning this test can be used) for the duration of the COVID-19 declaration under Section 564(b)(1) of the Act, 21 U.S.C. section 360bbb-3(b)(1), unless the authorization is terminated or revoked.  Performed at [facility], [address] [place], [place] [address]   Urine culture     Status: Abnormal   Collection Time: 03/07/21  6:59 PM   Specimen: Urine, Random  Result Value Ref Range Status   Specimen Description   Final    URINE, RANDOM Performed at [facility], [address] [place], [place] [address]    Special Requests   Final    NONE Performed at [facility], [address] [place], [place] [address]    Culture >=100,000 COLONIES/mL ESCHERICHIA COLI (A)  Final   Report Status 03/10/2021 FINAL  Final   Organism ID, Bacteria ESCHERICHIA COLI (A)  Final      Susceptibility   Escherichia coli - MIC*    AMPICILLIN <=2 SENSITIVE Sensitive     CEFAZOLIN <=4 SENSITIVE Sensitive     CEFEPIME <=0.12 SENSITIVE Sensitive     CEFTRIAXONE <=0.25 SENSITIVE Sensitive     CIPROFLOXACIN <=0.25 SENSITIVE Sensitive     GENTAMICIN <=1 SENSITIVE Sensitive     IMIPENEM <=0.25 SENSITIVE Sensitive     NITROFURANTOIN <=16 SENSITIVE Sensitive     TRIMETH/SULFA <=20 SENSITIVE Sensitive     AMPICILLIN/SULBACTAM <=2 SENSITIVE Sensitive     PIP/TAZO <=4 SENSITIVE Sensitive     * >=100,000 COLONIES/mL ESCHERICHIA COLI         Radiology Studies: No results found.      Scheduled Meds: . atorvastatin  40 mg Oral QHS  . Chlorhexidine Gluconate Cloth  6 each Topical Daily  . doxepin  10 mg Oral QHS  . enoxaparin (LOVENOX) injection  30 mg Subcutaneous Q24H  . irbesartan  300 mg Oral Daily  . metoprolol succinate  50 mg Oral Daily  . QUEtiapine  25 mg Oral QHS   Continuous Infusions: . cefTRIAXone (ROCEPHIN)  IV Stopped (03/11/21 0530)     LOS: 4 days    Time spent: 25 mins    Charise Killian, MD [facility] Pager [phone]  If 7PM-7AM, please contact night-coverage 03/11/2021, 7:44 AM

## 2021-03-12 ENCOUNTER — Inpatient Hospital Stay: Payer: PPO

## 2021-03-12 ENCOUNTER — Inpatient Hospital Stay: Payer: PPO | Admitting: Certified Registered"

## 2021-03-12 ENCOUNTER — Encounter
Admission: EM | Disposition: A | Discharge status: Hospice | Payer: Self-pay | Source: Home / Self Care | Attending: Internal Medicine

## 2021-03-12 DIAGNOSIS — N390 Urinary tract infection, site not specified: Secondary | ICD-10-CM | POA: Diagnosis not present

## 2021-03-12 DIAGNOSIS — F0391 Unspecified dementia with behavioral disturbance: Secondary | ICD-10-CM | POA: Diagnosis not present

## 2021-03-12 DIAGNOSIS — S72002A Fracture of unspecified part of neck of left femur, initial encounter for closed fracture: Secondary | ICD-10-CM | POA: Diagnosis not present

## 2021-03-12 HISTORY — PX: FEMUR IM NAIL: SHX1597

## 2021-03-12 LAB — BASIC METABOLIC PANEL
Anion gap: 9 (ref 5–15)
BUN: 25 mg/dL — ABNORMAL HIGH (ref 8–23)
CO2: 25 mmol/L (ref 22–32)
Calcium: 8.1 mg/dL — ABNORMAL LOW (ref 8.9–10.3)
Chloride: 101 mmol/L (ref 98–111)
Creatinine, Ser: 0.8 mg/dL (ref 0.44–1.00)
GFR, Estimated: >60 mL/min (ref >60)
Glucose, Bld: 94 mg/dL (ref 70–99)
Potassium: 4.2 mmol/L (ref 3.5–5.1)
Sodium: 135 mmol/L (ref 135–145)

## 2021-03-12 LAB — HEMOGLOBIN AND HEMATOCRIT, BLOOD
HCT: 27.4 % — ABNORMAL LOW (ref 36.0–46.0)
Hemoglobin: 8.7 g/dL — ABNORMAL LOW (ref 12.0–15.0)

## 2021-03-12 LAB — TYPE AND SCREEN
ABO/RH(D): O POS
Antibody Screen: NEGATIVE

## 2021-03-12 LAB — CBC
HCT: 26.2 % — ABNORMAL LOW (ref 36.0–46.0)
Hemoglobin: 8.5 g/dL — ABNORMAL LOW (ref 12.0–15.0)
MCH: 32.2 pg (ref 26.0–34.0)
MCHC: 32.4 g/dL (ref 30.0–36.0)
MCV: 99.2 fL (ref 80.0–100.0)
Platelets: 147 10*3/uL — ABNORMAL LOW (ref 150–400)
RBC: 2.64 MIL/uL — ABNORMAL LOW (ref 3.87–5.11)
RDW: 14.6 % (ref 11.5–15.5)
WBC: 11.8 10*3/uL — ABNORMAL HIGH (ref 4.0–10.5)
nRBC: 0 % (ref 0.0–0.2)

## 2021-03-12 SURGERY — INSERTION, INTRAMEDULLARY ROD, FEMUR
Anesthesia: General | Laterality: Left

## 2021-03-12 MED ORDER — DOCUSATE SODIUM 100 MG PO CAPS
100.0000 mg | ORAL_CAPSULE | Freq: Two times a day (BID) | ORAL | Status: DC
  Administered 2021-03-12 – 2021-03-23 (×16): 100 mg via ORAL
  Filled 2021-03-12 (×23): qty 1

## 2021-03-12 MED ORDER — METOCLOPRAMIDE HCL 5 MG/ML IJ SOLN
5.0000 mg | Freq: Three times a day (TID) | INTRAMUSCULAR | Status: DC | PRN
Start: 2021-03-12 — End: 2021-03-24

## 2021-03-12 MED ORDER — CLOPIDOGREL BISULFATE 75 MG PO TABS
75.0000 mg | ORAL_TABLET | Freq: Every day | ORAL | Status: DC
  Administered 2021-03-13 – 2021-03-24 (×10): 75 mg via ORAL
  Filled 2021-03-12 (×12): qty 1

## 2021-03-12 MED ORDER — ONDANSETRON HCL 4 MG/2ML IJ SOLN
INTRAMUSCULAR | Status: DC | PRN
  Administered 2021-03-12: 4 mg via INTRAVENOUS

## 2021-03-12 MED ORDER — METOCLOPRAMIDE HCL 10 MG PO TABS: 5.0000 mg | ORAL_TABLET | Freq: Three times a day (TID) | ORAL | Status: DC | PRN

## 2021-03-12 MED ORDER — ONDANSETRON HCL 4 MG/2ML IJ SOLN: 4.0000 mg | Freq: Once | INTRAMUSCULAR | Status: DC | PRN

## 2021-03-12 MED ORDER — PROPOFOL 10 MG/ML IV BOLUS
INTRAVENOUS | Status: DC | PRN
  Administered 2021-03-12: 100 mg via INTRAVENOUS

## 2021-03-12 MED ORDER — BUPIVACAINE-EPINEPHRINE 0.25% -1:200000 IJ SOLN
INTRAMUSCULAR | Status: DC | PRN
  Administered 2021-03-12: 30 mL

## 2021-03-12 MED ORDER — DOCUSATE SODIUM 100 MG PO CAPS: 200.0000 mg | ORAL_CAPSULE | Freq: Two times a day (BID) | ORAL | Status: DC

## 2021-03-12 MED ORDER — POLYETHYLENE GLYCOL 3350 17 G PO PACK
17.0000 g | PACK | Freq: Every day | ORAL | Status: DC
  Administered 2021-03-13 – 2021-03-17 (×5): 17 g via ORAL
  Filled 2021-03-12 (×11): qty 1

## 2021-03-12 MED ORDER — FENTANYL CITRATE (PF) 100 MCG/2ML IJ SOLN
25.0000 ug | INTRAMUSCULAR | Status: DC | PRN
Start: 2021-03-12 — End: 2021-03-12

## 2021-03-12 MED ORDER — ONDANSETRON HCL 4 MG/2ML IJ SOLN: 4.0000 mg | Freq: Four times a day (QID) | INTRAMUSCULAR | Status: DC | PRN

## 2021-03-12 MED ORDER — SUGAMMADEX SODIUM 200 MG/2ML IV SOLN
INTRAVENOUS | Status: DC | PRN
  Administered 2021-03-12: 100 mg via INTRAVENOUS

## 2021-03-12 MED ORDER — PHENYLEPHRINE HCL (PRESSORS) 10 MG/ML IV SOLN
INTRAVENOUS | Status: DC | PRN
  Administered 2021-03-12 (×2): 100 ug via INTRAVENOUS
  Administered 2021-03-12 (×2): 50 ug via INTRAVENOUS
  Administered 2021-03-12: 200 ug via INTRAVENOUS

## 2021-03-12 MED ORDER — FENTANYL CITRATE (PF) 100 MCG/2ML IJ SOLN
INTRAMUSCULAR | Status: AC
  Filled 2021-03-12: qty 2

## 2021-03-12 MED ORDER — DEXAMETHASONE SODIUM PHOSPHATE 10 MG/ML IJ SOLN
INTRAMUSCULAR | Status: AC
  Filled 2021-03-12: qty 1

## 2021-03-12 MED ORDER — BUPIVACAINE-EPINEPHRINE (PF) 0.25% -1:200000 IJ SOLN
INTRAMUSCULAR | Status: AC
  Filled 2021-03-12: qty 30

## 2021-03-12 MED ORDER — ROCURONIUM BROMIDE 10 MG/ML (PF) SYRINGE
PREFILLED_SYRINGE | INTRAVENOUS | Status: AC
  Filled 2021-03-12: qty 10

## 2021-03-12 MED ORDER — DEXAMETHASONE SODIUM PHOSPHATE 10 MG/ML IJ SOLN
INTRAMUSCULAR | Status: DC | PRN
  Administered 2021-03-12: 4 mg via INTRAVENOUS

## 2021-03-12 MED ORDER — LIDOCAINE HCL (CARDIAC) PF 100 MG/5ML IV SOSY
PREFILLED_SYRINGE | INTRAVENOUS | Status: DC | PRN
  Administered 2021-03-12: 50 mg via INTRAVENOUS

## 2021-03-12 MED ORDER — LIDOCAINE HCL (PF) 2 % IJ SOLN
INTRAMUSCULAR | Status: AC
  Filled 2021-03-12: qty 5

## 2021-03-12 MED ORDER — FENTANYL CITRATE (PF) 100 MCG/2ML IJ SOLN
INTRAMUSCULAR | Status: DC | PRN
  Administered 2021-03-12 (×2): 25 ug via INTRAVENOUS

## 2021-03-12 MED ORDER — ONDANSETRON HCL 4 MG/2ML IJ SOLN
INTRAMUSCULAR | Status: AC
  Filled 2021-03-12: qty 2

## 2021-03-12 MED ORDER — ROCURONIUM BROMIDE 100 MG/10ML IV SOLN
INTRAVENOUS | Status: DC | PRN
  Administered 2021-03-12: 40 mg via INTRAVENOUS

## 2021-03-12 MED ORDER — LACTATED RINGERS IV SOLN: INTRAVENOUS | Status: DC | PRN

## 2021-03-12 MED ORDER — ONDANSETRON HCL 4 MG PO TABS: 4.0000 mg | ORAL_TABLET | Freq: Four times a day (QID) | ORAL | Status: DC | PRN

## 2021-03-12 SURGICAL SUPPLY — 38 items
BIT DRILL CANN 16 HIP (BIT) ×1 IMPLANT
BLADE TFNA HELICAL 95 NON STRL (Anchor) ×1 IMPLANT
BNDG COHESIVE 4X5 TAN STRL (GAUZE/BANDAGES/DRESSINGS) IMPLANT
BRUSH SCRUB EZ  4% CHG (MISCELLANEOUS) ×2
BRUSH SCRUB EZ 4% CHG (MISCELLANEOUS) ×2 IMPLANT
CANISTER SUCT 1200ML W/VALVE (MISCELLANEOUS) ×1 IMPLANT
CHLORAPREP W/TINT 26 (MISCELLANEOUS) ×2 IMPLANT
COVER WAND RF STERILE (DRAPES) ×2 IMPLANT
DRAPE 3/4 80X56 (DRAPES) ×2 IMPLANT
DRAPE U-SHAPE 47X51 STRL (DRAPES) ×2 IMPLANT
DRSG AQUACEL AG ADV 3.5X 4 (GAUZE/BANDAGES/DRESSINGS) ×2 IMPLANT
DRSG AQUACEL AG ADV 3.5X10 (GAUZE/BANDAGES/DRESSINGS) ×1 IMPLANT
ELECT REM PT RETURN 9FT ADLT (ELECTROSURGICAL) ×2
ELECTRODE REM PT RTRN 9FT ADLT (ELECTROSURGICAL) ×1 IMPLANT
GAUZE XEROFORM 1X8 LF (GAUZE/BANDAGES/DRESSINGS) ×2 IMPLANT
GLOVE SURG ORTHO LTX SZ8 (GLOVE) ×2 IMPLANT
GLOVE SURG UNDER LTX SZ8 (GLOVE) ×2 IMPLANT
GOWN STRL REUS W/ TWL LRG LVL3 (GOWN DISPOSABLE) ×1 IMPLANT
GOWN STRL REUS W/ TWL XL LVL3 (GOWN DISPOSABLE) ×1 IMPLANT
GOWN STRL REUS W/TWL LRG LVL3 (GOWN DISPOSABLE) ×1
GOWN STRL REUS W/TWL XL LVL3 (GOWN DISPOSABLE) ×1
GUIDEWIRE 3.2X400 (WIRE) ×1 IMPLANT
KIT PATIENT CARE HANA TABLE (KITS) ×2 IMPLANT
KIT TURNOVER CYSTO (KITS) ×2 IMPLANT
MANIFOLD NEPTUNE II (INSTRUMENTS) ×2 IMPLANT
MAT ABSORB  FLUID 56X50 GRAY (MISCELLANEOUS) ×1
MAT ABSORB FLUID 56X50 GRAY (MISCELLANEOUS) ×1 IMPLANT
NAIL CANN TFNA 9MM/130 360MM (Nail) ×1 IMPLANT
NDL SPNL 20GX3.5 QUINCKE YW (NEEDLE) ×1 IMPLANT
NEEDLE SPNL 20GX3.5 QUINCKE YW (NEEDLE) ×2 IMPLANT
NS IRRIG 1000ML POUR BTL (IV SOLUTION) ×2 IMPLANT
PACK HIP COMPR (MISCELLANEOUS) ×2 IMPLANT
STAPLER SKIN PROX 35W (STAPLE) ×2 IMPLANT
SUT VIC AB 0 CT1 36 (SUTURE) ×2 IMPLANT
SUT VIC AB 2-0 CT1 27 (SUTURE) ×2
SUT VIC AB 2-0 CT1 TAPERPNT 27 (SUTURE) ×2 IMPLANT
SYR 30ML LL (SYRINGE) ×2 IMPLANT
TOWEL OR 17X26 4PK STRL BLUE (TOWEL DISPOSABLE) ×2 IMPLANT

## 2021-03-12 NOTE — Plan of Care (Signed)

## 2021-03-12 NOTE — H&P (Signed)
The patient has been re-examined, and the chart reviewed, and there have been no interval changes to the documented history and physical.  Plan a left hip trochanteric femoral nail today.  Anesthesia is not consulted regarding a peripheral nerve block for post-operative pain.  The risks, benefits, and alternatives have been discussed at length, and the patient is willing to proceed.     

## 2021-03-12 NOTE — Anesthesia Postprocedure Evaluation (Signed)
Anesthesia Post Note  Patient: Sara Conley  Procedure(s) Performed: INTRAMEDULLARY (IM) NAIL FEMORAL (Left )  Patient location during evaluation: PACU Anesthesia Type: General Level of consciousness: awake and alert Pain management: pain level controlled Vital Signs Assessment: post-procedure vital signs reviewed and stable Respiratory status: spontaneous breathing and respiratory function stable Cardiovascular status: stable Anesthetic complications: no   No complications documented.   Last Vitals:  Vitals:   03/12/21 1832 03/12/21 1845  BP: (!) 118/41 (!) 111/44  Pulse:  84  Resp: 13 15  Temp: (!) 36.4 C   SpO2: 100% 100%    Last Pain:  Vitals:   03/12/21 1832  TempSrc:   PainSc: Asleep                 Sara Conley K

## 2021-03-12 NOTE — Anesthesia Procedure Notes (Signed)
Procedure Name: Intubation Date/Time: 03/12/2021 5:27 PM Performed by: Irving Burton, CRNA Pre-anesthesia Checklist: Patient identified, Emergency Drugs available, Suction available and Patient being monitored Patient Re-evaluated:Patient Re-evaluated prior to induction Oxygen Delivery Method: Circle system utilized Preoxygenation: Pre-oxygenation with 100% oxygen Induction Type: IV induction Ventilation: Mask ventilation without difficulty Laryngoscope Size: McGraph and 3 Grade View: Grade I Tube type: Oral Tube size: 7.0 mm Number of attempts: 1 Airway Equipment and Method: Stylet and Video-laryngoscopy Placement Confirmation: ETT inserted through vocal cords under direct vision,  positive ETCO2 and breath sounds checked- equal and bilateral Secured at: 21 cm Tube secured with: Tape Dental Injury: Teeth and Oropharynx as per pre-operative assessment

## 2021-03-12 NOTE — Transfer of Care (Signed)
Immediate Anesthesia Transfer of Care Note  Patient: Sara Conley  Procedure(s) Performed: INTRAMEDULLARY (IM) NAIL FEMORAL (Left )  Patient Location: PACU  Anesthesia Type:General  Level of Consciousness: sedated  Airway & Oxygen Therapy: Patient Spontanous Breathing and Patient connected to face mask oxygen  Post-op Assessment: Post -op Vital signs reviewed and stable  Post vital signs: Reviewed and stable  Last Vitals:  Vitals Value Taken Time  BP 118/41   Temp    Pulse 85   Resp 13 03/12/21 1832  SpO2 100   Vitals shown include unvalidated device data.  Last Pain:  Vitals:   03/12/21 1503  TempSrc: Temporal  PainSc: Asleep         Complications: No complications documented.

## 2021-03-12 NOTE — Progress Notes (Signed)
PROGRESS NOTE    Sara Conley  YKD:[id number] DOB: 06-30-1918 DOA: 03/07/2021 PCP: Marisue Ivan, MD   Assessment & Plan:   Principal Problem:   left Hip fracture (HCC) Active Problems:   Anxiety and depression   Coronary artery disease involving native coronary artery of native heart without angina pectoris   Essential hypertension   Other insomnia   Pure hypercholesterolemia   Valvular heart disease   Acute lower UTI   Left intertrochanteric fracture: s/p mechanical fall. Continue to hold home dose of plavix. Will go for surg 03/12/21. Norco, morphine prn for pain   HTN: continue on metoprolol, irbesartan  HLD: continue on statin   UTI: continue on IV rocephin. Urine cx is growing e. coli  Hx of CAD: continue on home dose of metoprolol, irbesartan, & statin. Continue to hold plavix   Dementia: continue w/ supportive care. Continue on seroquel qhs   Thrombocytopenia: etiology unclear, labile. Will continue to monitor   Macrocytic anemia: folate, B12 are WNL. H&H are stable   Leukocytosis: trending down. Will continue to monitor    DVT prophylaxis: lovenox  Code Status: full  Family Communication: discussed pt's care w/ pt's daughter at bedside and answered her questions  Disposition Plan: depends on PT/OT recs (not consulted yet)   Level of care: Med-Surg   Status is: Inpatient  Remains inpatient appropriate because:Ongoing diagnostic testing needed not appropriate for outpatient work up, Unsafe d/c plan, IV treatments appropriate due to intensity of illness or inability to take PO and Inpatient level of care appropriate due to severity of illness   Dispo: The patient is from: Home              Anticipated d/c is to: SNF              Patient currently is not medically stable to d/c.   Difficult to place patient: unclear     Consultants:   Ortho surg  Procedures:    Antimicrobials:   Subjective: Pt is c/o staying in bed and wanting to get out.    Objective: Vitals:   03/11/21 0728 03/11/21 1643 03/11/21 2031 03/12/21 0442  BP: (!) 105/55 (!) 149/58 106/61 (!) 119/55  Pulse: 96 100 (!) 58 92  Resp: 18 (!) 22 17 18   Temp: 98 F (36.7 C) 97.7 F (36.5 C) 97.8 F (36.6 C) 97.9 F (36.6 C)  TempSrc:      SpO2: 100% 100% 100% 100%  Weight:      Height:        Intake/Output Summary (Last 24 hours) at 03/12/2021 0725 Last data filed at 03/12/2021 0446 Gross per 24 hour  Intake 720 ml  Output 300 ml  Net 420 ml   Filed Weights   03/07/21 1717  Weight: 42.6 kg    Examination:  General exam: Appears calm & comfortable. Frail appearing  Respiratory system: clear breath sounds b/l   Cardiovascular system: S1 & S2+. No rubs or gallops  Gastrointestinal system: Abd is soft, NT, ND & hypoactive bowel sounds  Central nervous system: Lethargic. Moves all extremities  Psychiatry: judgement and insight appear abnormal     Data Reviewed: I have personally reviewed following labs and imaging studies  CBC: Recent Labs  Lab 03/07/21 1859 03/09/21 0735 03/10/21 0812 03/11/21 0509 03/12/21 0453  WBC 11.1* 9.3 8.0 12.0* 11.8*  NEUTROABS 8.6*  --   --   --   --   HGB 10.7* 8.7* 7.9* 8.2* 8.5*  HCT 32.6*  27.6* 25.1* 25.1* 26.2*  MCV 97.3 99.3 100.4* 97.7 99.2  PLT 189 131* 113* 129* 147*   Basic Metabolic Panel: Recent Labs  Lab 03/08/21 0614 03/09/21 0735 03/10/21 0812 03/11/21 0509 03/12/21 0453  NA 136 136 133* 133* 135  K 4.9 4.5 4.3 4.0 4.2  CL 103 104 101 101 101  CO2 28 26 27 27 25   GLUCOSE 99 109* 83 107* 94  BUN 37* 38* 29* 18 25*  CREATININE 1.06* 0.97 0.73 0.59 0.80  CALCIUM 8.5* 8.4* 8.1* 8.2* 8.1*   GFR: Estimated Creatinine Clearance: 24.1 mL/min (by C-G formula based on SCr of 0.8 mg/dL). Liver Function Tests: Recent Labs  Lab 03/07/21 2005 03/08/21 0614  AST 23 18  ALT 11 10  ALKPHOS 51 48  BILITOT 0.5 0.6  PROT 6.1* 5.7*  ALBUMIN 3.5 3.1*   No results for input(s): LIPASE, AMYLASE in  the last 168 hours. No results for input(s): AMMONIA in the last 168 hours. Coagulation Profile: No results for input(s): INR, PROTIME in the last 168 hours. Cardiac Enzymes: No results for input(s): CKTOTAL, CKMB, CKMBINDEX, TROPONINI in the last 168 hours. BNP (last 3 results) No results for input(s): PROBNP in the last 8760 hours. HbA1C: No results for input(s): HGBA1C in the last 72 hours. CBG: No results for input(s): GLUCAP in the last 168 hours. Lipid Profile: No results for input(s): CHOL, HDL, LDLCALC, TRIG, CHOLHDL, LDLDIRECT in the last 72 hours. Thyroid Function Tests: No results for input(s): TSH, T4TOTAL, FREET4, T3FREE, THYROIDAB in the last 72 hours. Anemia Panel: Recent Labs    03/11/21 0509  VITAMINB12 720  FOLATE 13.2   Sepsis Labs: No results for input(s): PROCALCITON, LATICACIDVEN in the last 168 hours.  Recent Results (from the past 240 hour(s))  Resp Panel by RT-PCR (Flu A&B, Covid) Nasopharyngeal Swab     Status: None   Collection Time: 03/07/21  6:59 PM   Specimen: Nasopharyngeal Swab; Nasopharyngeal(NP) swabs in vial transport medium  Result Value Ref Range Status   SARS Coronavirus 2 by RT PCR NEGATIVE NEGATIVE Final    Comment: (NOTE) SARS-CoV-2 target nucleic acids are NOT DETECTED.  The SARS-CoV-2 RNA is generally detectable in upper respiratory specimens during the acute phase of infection. The lowest concentration of SARS-CoV-2 viral copies this assay can detect is 138 copies/mL. A negative result does not preclude SARS-Cov-2 infection and should not be used as the sole basis for treatment or other patient management decisions. A negative result may occur with  improper specimen collection/handling, submission of specimen other than nasopharyngeal swab, presence of viral mutation(s) within the areas targeted by this assay, and inadequate number of viral copies(<138 copies/mL). A negative result must be combined with clinical observations,  patient history, and epidemiological information. The expected result is Negative.  Fact Sheet for Patients:  05/07/21  Fact Sheet for Healthcare Providers:  BloggerCourse.com  This test is no t yet approved or cleared by the [employer] FDA and  has been authorized for detection and/or diagnosis of SARS-CoV-2 by FDA under an Emergency Use Authorization (EUA). This EUA will remain  in effect (meaning this test can be used) for the duration of the COVID-19 declaration under Section 564(b)(1) of the Act, 21 U.S.C.section 360bbb-3(b)(1), unless the authorization is terminated  or revoked sooner.       Influenza A by PCR NEGATIVE NEGATIVE Final   Influenza B by PCR NEGATIVE NEGATIVE Final    Comment: (NOTE) The Xpert Xpress SARS-CoV-2/FLU/RSV plus assay is  intended as an aid in the diagnosis of influenza from Nasopharyngeal swab specimens and should not be used as a sole basis for treatment. Nasal washings and aspirates are unacceptable for Xpert Xpress SARS-CoV-2/FLU/RSV testing.  Fact Sheet for Patients: BloggerCourse.com  Fact Sheet for Healthcare Providers: [employer]  This test is not yet approved or cleared by the [place] FDA and has been authorized for detection and/or diagnosis of SARS-CoV-2 by FDA under an Emergency Use Authorization (EUA). This EUA will remain in effect (meaning this test can be used) for the duration of the COVID-19 declaration under Section 564(b)(1) of the Act, 21 U.S.C. section 360bbb-3(b)(1), unless the authorization is terminated or revoked.  Performed at [facility], [address] [place], [place] [address]   Urine culture     Status: Abnormal   Collection Time: 03/07/21  6:59 PM   Specimen: Urine, Random  Result Value Ref Range Status   Specimen Description   Final    URINE, RANDOM Performed at [facility], [address] [place], [place] [address]    Special Requests   Final    NONE Performed at [facility], [address] [place], [place] [address]    Culture >=100,000 COLONIES/mL ESCHERICHIA COLI (A)  Final   Report Status 03/10/2021 FINAL  Final   Organism ID, Bacteria ESCHERICHIA COLI (A)  Final      Susceptibility   Escherichia coli - MIC*    AMPICILLIN <=2 SENSITIVE Sensitive     CEFAZOLIN <=4 SENSITIVE Sensitive     CEFEPIME <=0.12 SENSITIVE Sensitive     CEFTRIAXONE <=0.25 SENSITIVE Sensitive     CIPROFLOXACIN <=0.25 SENSITIVE Sensitive     GENTAMICIN <=1 SENSITIVE Sensitive     IMIPENEM <=0.25 SENSITIVE Sensitive     NITROFURANTOIN <=16 SENSITIVE Sensitive     TRIMETH/SULFA <=20 SENSITIVE Sensitive     AMPICILLIN/SULBACTAM <=2 SENSITIVE Sensitive     PIP/TAZO <=4 SENSITIVE Sensitive     * >=100,000 COLONIES/mL ESCHERICHIA COLI         Radiology Studies: No results found.      Scheduled Meds: . atorvastatin  40 mg Oral QHS  . Chlorhexidine Gluconate Cloth  6 each Topical Daily  . doxepin  10 mg Oral QHS  . irbesartan  300 mg Oral Daily  . metoprolol succinate  50 mg Oral Daily  . QUEtiapine  25 mg Oral QHS   Continuous Infusions: . cefTRIAXone (ROCEPHIN)  IV 1 g (03/11/21 2200)     LOS: 5 days    Time spent: 28 mins    Charise Killian, MD [facility] Pager [phone]  If 7PM-7AM, please contact night-coverage 03/12/2021, 7:25 AM

## 2021-03-12 NOTE — Progress Notes (Signed)
Pt BP 92/47 with MAP of 59. Manuela Schwartz made aware. New order received. Will cont to monitor the pt.

## 2021-03-12 NOTE — Plan of Care (Signed)
  Problem: Activity: Goal: Risk for activity intolerance will decrease Outcome: Progressing   Problem: Coping: Goal: Level of anxiety will decrease Outcome: Progressing   Problem: Pain Managment: Goal: General experience of comfort will improve Outcome: Progressing   Problem: Education: Goal: Verbalization of understanding the information provided (i.e., activity precautions, restrictions, etc) will improve Outcome: Progressing   Problem: Activity: Goal: Ability to ambulate and perform ADLs will improve Outcome: Progressing

## 2021-03-12 NOTE — OR PostOp (Incomplete)
PACU TO INPATIENT HANDOFF REPORT  Name/Age/Gender Sara Conley 87 y.o. female  Code Status    Code Status Orders  (From admission, onward)         Start     Ordered   03/08/21 1106  Do not attempt resuscitation (DNR)  Continuous       Question Answer Comment  In the event of cardiac or respiratory ARREST Do not call a "code blue"   In the event of cardiac or respiratory ARREST Do not perform Intubation, CPR, defibrillation or ACLS   In the event of cardiac or respiratory ARREST Use medication by any route, position, wound care, and other measures to relive pain and suffering. May use oxygen, suction and manual treatment of airway obstruction as needed for comfort.      03/08/21 1105        Code Status History    Date Active Date Inactive Code Status Order ID Comments User Context   03/07/2021 2050 03/08/2021 1105 Full Code [id number]  Rometta Emery, MD ED   Advance Care Planning Activity    Advance Directive Documentation   Flowsheet Row Most Recent Value  Type of Advance Directive Healthcare Power of Attorney, Living will  Pre-existing out of facility DNR order (yellow form or pink MOST form) -  "MOST" Form in Place? -      Home/SNF/Other {Discharge Destination:18313::"Home"}  Chief Complaint Hip fracture (HCC) [S72.009A] Fall [W19.XXXA]  Level of Care/Admitting Diagnosis ED Disposition    ED Disposition Condition Comment   Admit  Hospital Area: [facility] [[id number]]  Level of Care: Med-Surg [16]  Covid Evaluation: Asymptomatic Screening Protocol (No Symptoms)  Diagnosis: Hip fracture [facility]) [[id number]]  Admitting Physician: Rometta Emery [[id number]]  Attending Physician: Rometta Emery [[id number]]  Estimated length of stay: past midnight tomorrow  Certification:: I certify this patient will need inpatient services for at least 2 midnights       Medical History Past Medical History:  Diagnosis Date  . Hyperlipidemia   . Hypertension      Allergies No Known Allergies  IV Location/Drains/Wounds Patient Lines/Drains/Airways Status    Active Line/Drains/Airways    Name Placement date Placement time Site Days   Peripheral IV 03/12/21 Right Hand 03/12/21  1736  Hand  less than 1   Urethral Catheter Double-lumen 03/07/21  1946  Double-lumen  5   Incision (Closed) 03/12/21 Hip Left 03/12/21  1757  - less than 1   Wound / Incision (Open or Dehisced) 03/09/21 Non-pressure wound Arm Anterior;Left;Proximal;Upper skin tear 03/09/21  0800  Arm  3          Labs/Imaging Results for orders placed or performed during the hospital encounter of 03/07/21 (from the past 48 hour(s))  CBC     Status: Abnormal   Collection Time: 03/11/21  5:09 AM  Result Value Ref Range   WBC 12.0 (H) 4.0 - 10.5 K/uL   RBC 2.57 (L) 3.87 - 5.11 MIL/uL   Hemoglobin 8.2 (L) 12.0 - 15.0 g/dL   HCT 91.4 (L) 78.2 - 95.6 %   MCV 97.7 80.0 - 100.0 fL   MCH 31.9 26.0 - 34.0 pg   MCHC 32.7 30.0 - 36.0 g/dL   RDW 21.3 08.6 - 57.8 %   Platelets 129 (L) 150 - 400 K/uL   nRBC 0.0 0.0 - 0.2 %    Comment: Performed at [facility], [address], [place], [place] [address]  Basic metabolic panel  Status: Abnormal   Collection Time: 03/11/21  5:09 AM  Result Value Ref Range   Sodium 133 (L) 135 - 145 mmol/L   Potassium 4.0 3.5 - 5.1 mmol/L   Chloride 101 98 - 111 mmol/L   CO2 27 22 - 32 mmol/L   Glucose, Bld 107 (H) 70 - 99 mg/dL    Comment: Glucose reference range applies only to samples taken after fasting for at least 8 hours.   BUN 18 8 - 23 mg/dL   Creatinine, Ser 5.72 0.44 - 1.00 mg/dL   Calcium 8.2 (L) 8.9 - 10.3 mg/dL   GFR, Estimated >62 >03 mL/min    Comment: (NOTE) Calculated using the CKD-EPI Creatinine Equation (2021)    Anion gap 5 5 - 15    Comment: Performed at [facility], [address] [place], [place] [address]  Folate, serum, performed at [facility] lab     Status: None   Collection Time: 03/11/21  5:09  AM  Result Value Ref Range   Folate 13.2 >5.9 ng/mL    Comment: Performed at [facility], [address] [place], [place] [address]  Vitamin B12     Status: None   Collection Time: 03/11/21  5:09 AM  Result Value Ref Range   Vitamin B-12 720 180 - 914 pg/mL    Comment: (NOTE) This assay is not validated for testing neonatal or myeloproliferative syndrome specimens for Vitamin B12 levels. Performed at [facility], [address] [place], [place] [address]   CBC     Status: Abnormal   Collection Time: 03/12/21  4:53 AM  Result Value Ref Range   WBC 11.8 (H) 4.0 - 10.5 K/uL   RBC 2.64 (L) 3.87 - 5.11 MIL/uL   Hemoglobin 8.5 (L) 12.0 - 15.0 g/dL   HCT 80.3 (L) 21.2 - 24.8 %   MCV 99.2 80.0 - 100.0 fL   MCH 32.2 26.0 - 34.0 pg   MCHC 32.4 30.0 - 36.0 g/dL   RDW 25.0 03.7 - 04.8 %   Platelets 147 (L) 150 - 400 K/uL   nRBC 0.0 0.0 - 0.2 %    Comment: Performed at [facility], [address] [place], [place] [address]  Basic metabolic panel     Status: Abnormal   Collection Time: 03/12/21  4:53 AM  Result Value Ref Range   Sodium 135 135 - 145 mmol/L   Potassium 4.2 3.5 - 5.1 mmol/L   Chloride 101 98 - 111 mmol/L   CO2 25 22 - 32 mmol/L   Glucose, Bld 94 70 - 99 mg/dL    Comment: Glucose reference range applies only to samples taken after fasting for at least 8 hours.   BUN 25 (H) 8 - 23 mg/dL   Creatinine, Ser 9.45 0.44 - 1.00 mg/dL   Calcium 8.1 (L) 8.9 - 10.3 mg/dL   GFR, Estimated >03 >88 mL/min    Comment: (NOTE) Calculated using the CKD-EPI Creatinine Equation (2021)    Anion gap 9 5 - 15    Comment: Performed at [facility], [address] [place], [place] [address]  Type and screen [facility]     Status: None   Collection Time: 03/12/21  4:55 AM  Result Value Ref Range   ABO/RH(D) O POS    Antibody Screen NEG    Sample Expiration      03/15/2021,2359 Performed at [facility], [address], [place], [place]  [phone]    DG HIP OPERATIVE UNILAT W OR W/O PELVIS LEFT  Result Date: 03/12/2021 CLINICAL DATA:  Fracture fixation. EXAM: OPERATIVE LEFT HIP (WITH PELVIS IF PERFORMED) TECHNIQUE: Fluoroscopic spot image(s) were submitted for interpretation post-operatively. COMPARISON:  Preoperative radiograph 03/07/2021 FINDINGS: Three fluoroscopic spot views of the left hip and femur obtained in the operating room. Intramedullary nail with trans trochanteric screw fixation traversing intertrochanteric femur fracture. Fluoroscopy time 22 seconds. IMPRESSION: Procedural fluoroscopy during intertrochanteric femur fracture fixation. Electronically Signed   By: Narda RutherfordMelanie  Sanford M.D.   On: 03/12/2021 18:21    Pending Labs   Vitals/Pain Today's Vitals   03/12/21 1845 03/12/21 1855 03/12/21 1900 03/12/21 1915  BP: (!) 111/44  (!) 108/43 (!) 106/46  Pulse: 84 88 87 88  Resp: 15 18 (!) 21 (!) 24  Temp:    (!) 97.4 F (36.3 C)  TempSrc:      SpO2: 100% 95% 94% 96%  Weight:      Height:      PainSc:  0-No pain  0-No pain    Isolation Precautions @ISOLATION @  Administered Medications Periop Administered Meds from 03/12/2021 1451 to 03/12/2021 1925      Date/Time Order Dose Route Action Action by Comments    03/12/2021 1451 acetaminophen (TYLENOL) suppository 650 mg   Rectal MAR Hold Transfer Provider, Automatic     03/12/2021 1451 acetaminophen (TYLENOL) tablet 650 mg   Oral MAR Hold Transfer Provider, Automatic     03/12/2021 1451 ALPRAZolam (XANAX) tablet 0.25 mg   Oral MAR Hold Transfer Provider, Automatic     03/23/2021 2200 atorvastatin (LIPITOR) tablet 40 mg   Oral Automatically Held Location managerTransfer Provider, Automatic     03/22/2021 2200 atorvastatin (LIPITOR) tablet 40 mg   Oral Automatically Held Location managerTransfer Provider, Automatic     03/21/2021 2200 atorvastatin (LIPITOR) tablet 40 mg   Oral Automatically Held Location managerTransfer Provider, Automatic     03/20/2021 2200 atorvastatin (LIPITOR) tablet  40 mg   Oral Automatically Held Location managerTransfer Provider, Automatic     03/19/2021 2200 atorvastatin (LIPITOR) tablet 40 mg   Oral Automatically Held Location managerTransfer Provider, Automatic     03/18/2021 2200 atorvastatin (LIPITOR) tablet 40 mg   Oral Automatically Held Location managerTransfer Provider, Automatic     03/17/2021 2200 atorvastatin (LIPITOR) tablet 40 mg   Oral Automatically Held Location managerTransfer Provider, Automatic     03/16/2021 2200 atorvastatin (LIPITOR) tablet 40 mg   Oral Automatically Held Location managerTransfer Provider, Automatic     03/15/2021 2200 atorvastatin (LIPITOR) tablet 40 mg   Oral Automatically Held Location managerTransfer Provider, Automatic     03/14/2021 2200 atorvastatin (LIPITOR) tablet 40 mg   Oral Automatically Held Location managerTransfer Provider, Automatic     03/13/2021 2200 atorvastatin (LIPITOR) tablet 40 mg   Oral Automatically Held Location managerTransfer Provider, Automatic     03/12/2021 2200 atorvastatin (LIPITOR) tablet 40 mg   Oral Automatically Held Transfer Provider, Automatic     03/12/2021 1451 atorvastatin (LIPITOR) tablet 40 mg   Oral MAR Hold Transfer Provider, Automatic     03/12/2021 1813 bupivacaine-EPINEPHrine (MARCAINE W/ EPI) 0.25% -1:200000 (with pres) injection 30 mL Infiltration Given Lyndle HerrlichBowers, James R, MD     03/24/2021 1000 Chlorhexidine Gluconate Cloth 2 % PADS 6 each   Topical Automatically Held Transfer Provider, Automatic     03/23/2021 1000 Chlorhexidine Gluconate Cloth 2 % PADS 6 each   Topical Automatically Held Transfer Provider, Automatic     03/22/2021 1000 Chlorhexidine Gluconate Cloth 2 % PADS 6 each  Topical Automatically Solicitor, Automatic     03/21/2021 1000 Chlorhexidine Gluconate Cloth 2 % PADS 6 each   Topical Automatically Solicitor, Automatic     03/20/2021 1000 Chlorhexidine Gluconate Cloth 2 % PADS 6 each   Topical Automatically Solicitor, Automatic     03/19/2021 1000 Chlorhexidine Gluconate Cloth 2 % PADS 6 each   Topical Automatically Solicitor,  Automatic     03/18/2021 1000 Chlorhexidine Gluconate Cloth 2 % PADS 6 each   Topical Automatically Held Transfer Provider, Automatic     03/17/2021 1000 Chlorhexidine Gluconate Cloth 2 % PADS 6 each   Topical Automatically Held Transfer Provider, Automatic     03/16/2021 1000 Chlorhexidine Gluconate Cloth 2 % PADS 6 each   Topical Automatically Held Transfer Provider, Automatic     03/15/2021 1000 Chlorhexidine Gluconate Cloth 2 % PADS 6 each   Topical Automatically Held Transfer Provider, Automatic     03/14/2021 1000 Chlorhexidine Gluconate Cloth 2 % PADS 6 each   Topical Automatically Held Transfer Provider, Automatic     03/13/2021 1000 Chlorhexidine Gluconate Cloth 2 % PADS 6 each   Topical Automatically Held Transfer Provider, Automatic     03/12/2021 1451 Chlorhexidine Gluconate Cloth 2 % PADS 6 each   Topical MAR Hold Transfer Provider, Automatic     03/12/2021 1451 cloNIDine (CATAPRES) tablet 0.1 mg   Oral MAR Hold Transfer Provider, Automatic     03/12/2021 1758 dexamethasone (DECADRON) injection 4 mg Intravenous Given Irving Burton, CRNA     03/20/2021 2200 docusate sodium (COLACE) capsule 200 mg   Oral Automatically Held Transfer Provider, Automatic     03/20/2021 1000 docusate sodium (COLACE) capsule 200 mg   Oral Automatically Held Transfer Provider, Automatic     03/19/2021 2200 docusate sodium (COLACE) capsule 200 mg   Oral Automatically Held Transfer Provider, Automatic     03/19/2021 1000 docusate sodium (COLACE) capsule 200 mg   Oral Automatically Held Transfer Provider, Automatic     03/18/2021 2200 docusate sodium (COLACE) capsule 200 mg   Oral Automatically Held Transfer Provider, Automatic     03/18/2021 1000 docusate sodium (COLACE) capsule 200 mg   Oral Automatically Held Transfer Provider, Automatic     03/17/2021 2200 docusate sodium (COLACE) capsule 200 mg   Oral Automatically Held Transfer Provider, Automatic     03/17/2021 1000 docusate sodium (COLACE) capsule 200  mg   Oral Automatically Held Transfer Provider, Automatic     03/16/2021 2200 docusate sodium (COLACE) capsule 200 mg   Oral Automatically Held Transfer Provider, Automatic     03/16/2021 1000 docusate sodium (COLACE) capsule 200 mg   Oral Automatically Held Transfer Provider, Automatic     03/15/2021 2200 docusate sodium (COLACE) capsule 200 mg   Oral Automatically Held Transfer Provider, Automatic     03/15/2021 1000 docusate sodium (COLACE) capsule 200 mg   Oral Automatically Held Transfer Provider, Automatic     03/14/2021 2200 docusate sodium (COLACE) capsule 200 mg   Oral Automatically Held Transfer Provider, Automatic     03/14/2021 1000 docusate sodium (COLACE) capsule 200 mg   Oral Automatically Held Transfer Provider, Automatic     03/13/2021 2200 docusate sodium (COLACE) capsule 200 mg   Oral Automatically Held Transfer Provider, Automatic     03/13/2021 1000 docusate sodium (COLACE) capsule 200 mg   Oral Automatically Held Transfer Provider, Automatic     03/12/2021 2200 docusate sodium (COLACE) capsule 200 mg   Oral Automatically Held  Transfer Provider, Automatic     03/12/2021 1451 docusate sodium (COLACE) capsule 200 mg   Oral MAR Hold Transfer Provider, Automatic     03/23/2021 2200 doxepin (SINEQUAN) capsule 10 mg   Oral Automatically Held Location manager, Automatic     03/22/2021 2200 doxepin (SINEQUAN) capsule 10 mg   Oral Automatically Held Location manager, Automatic     03/21/2021 2200 doxepin (SINEQUAN) capsule 10 mg   Oral Automatically Held Location manager, Automatic     03/20/2021 2200 doxepin (SINEQUAN) capsule 10 mg   Oral Automatically Held Location manager, Automatic     03/19/2021 2200 doxepin (SINEQUAN) capsule 10 mg   Oral Automatically Held Location manager, Automatic     03/18/2021 2200 doxepin (SINEQUAN) capsule 10 mg   Oral Automatically Held Location manager, Automatic     03/17/2021 2200 doxepin (SINEQUAN) capsule 10 mg   Oral Automatically Held  Location manager, Automatic     03/16/2021 2200 doxepin (SINEQUAN) capsule 10 mg   Oral Automatically Held Location manager, Automatic     03/15/2021 2200 doxepin (SINEQUAN) capsule 10 mg   Oral Automatically Held Transfer Provider, Automatic     03/14/2021 2200 doxepin (SINEQUAN) capsule 10 mg   Oral Automatically Held Transfer Provider, Automatic     03/13/2021 2200 doxepin (SINEQUAN) capsule 10 mg   Oral Automatically Held Transfer Provider, Automatic     03/12/2021 2200 doxepin (SINEQUAN) capsule 10 mg   Oral Automatically Held Transfer Provider, Automatic     03/12/2021 1451 doxepin (SINEQUAN) capsule 10 mg   Oral MAR Hold Transfer Provider, Automatic     03/12/2021 1758 fentaNYL (SUBLIMAZE) injection 25 mcg Intravenous Given Irving Burton, CRNA     03/12/2021 1746 fentaNYL (SUBLIMAZE) injection 25 mcg Intravenous Given Irving Burton, CRNA     03/12/2021 1451 haloperidol lactate (HALDOL) injection 1 mg   Intravenous MAR Hold Transfer Provider, Automatic     03/12/2021 1451 HYDROcodone-acetaminophen (NORCO/VICODIN) 5-325 MG per tablet 1 tablet   Oral MAR Hold Transfer Provider, Automatic     03/24/2021 1000 irbesartan (AVAPRO) tablet 300 mg   Oral Automatically Held Location manager, Automatic     03/23/2021 1000 irbesartan (AVAPRO) tablet 300 mg   Oral Automatically Held Location manager, Automatic     03/22/2021 1000 irbesartan (AVAPRO) tablet 300 mg   Oral Automatically Held Location manager, Automatic     03/21/2021 1000 irbesartan (AVAPRO) tablet 300 mg   Oral Automatically Held Location manager, Automatic     03/20/2021 1000 irbesartan (AVAPRO) tablet 300 mg   Oral Automatically Held Location manager, Automatic     03/19/2021 1000 irbesartan (AVAPRO) tablet 300 mg   Oral Automatically Held Location manager, Automatic     03/18/2021 1000 irbesartan (AVAPRO) tablet 300 mg   Oral Automatically Held Location manager, Automatic     03/17/2021 1000 irbesartan (AVAPRO) tablet  300 mg   Oral Automatically Held Location manager, Automatic     03/16/2021 1000 irbesartan (AVAPRO) tablet 300 mg   Oral Automatically Held Location manager, Automatic     03/15/2021 1000 irbesartan (AVAPRO) tablet 300 mg   Oral Automatically Held Location manager, Automatic     03/14/2021 1000 irbesartan (AVAPRO) tablet 300 mg   Oral Automatically Held Location manager, Automatic     03/13/2021 1000 irbesartan (AVAPRO) tablet 300 mg   Oral Automatically Held Transfer Provider, Automatic     03/12/2021 1451 irbesartan (AVAPRO) tablet 300 mg   Oral MAR Hold Transfer Provider, Automatic     03/12/2021 1451 ketorolac (  TORADOL) 15 MG/ML injection 15 mg   Intravenous MAR Hold Transfer Provider, Automatic     03/12/2021 1716 lactated ringers infusion   Intravenous New Bag/Given Irving Burton, CRNA     03/12/2021 1723 lidocaine (cardiac) 100 mg/54mL (XYLOCAINE) injection 2% 50 mg Intravenous Given Irving Burton, CRNA     03/12/2021 1451 LORazepam (ATIVAN) injection 0.5 mg   Intravenous MAR Hold Transfer Provider, Automatic     03/12/2021 1451 LORazepam (ATIVAN) tablet 0.5 mg   Oral MAR Hold Transfer Provider, Automatic     03/24/2021 1000 metoprolol succinate (TOPROL-XL) 24 hr tablet 50 mg   Oral Automatically Held Location manager, Automatic     03/23/2021 1000 metoprolol succinate (TOPROL-XL) 24 hr tablet 50 mg   Oral Automatically Held Location manager, Automatic     03/22/2021 1000 metoprolol succinate (TOPROL-XL) 24 hr tablet 50 mg   Oral Automatically Held Location manager, Automatic     03/21/2021 1000 metoprolol succinate (TOPROL-XL) 24 hr tablet 50 mg   Oral Automatically Held Location manager, Automatic     03/20/2021 1000 metoprolol succinate (TOPROL-XL) 24 hr tablet 50 mg   Oral Automatically Held Location manager, Automatic     03/19/2021 1000 metoprolol succinate (TOPROL-XL) 24 hr tablet 50 mg   Oral Automatically Held Location manager, Automatic     03/18/2021 1000  metoprolol succinate (TOPROL-XL) 24 hr tablet 50 mg   Oral Automatically Held Location manager, Automatic     03/17/2021 1000 metoprolol succinate (TOPROL-XL) 24 hr tablet 50 mg   Oral Automatically Held Location manager, Automatic     03/16/2021 1000 metoprolol succinate (TOPROL-XL) 24 hr tablet 50 mg   Oral Automatically Held Location manager, Automatic     03/15/2021 1000 metoprolol succinate (TOPROL-XL) 24 hr tablet 50 mg   Oral Automatically Held Transfer Provider, Automatic     03/14/2021 1000 metoprolol succinate (TOPROL-XL) 24 hr tablet 50 mg   Oral Automatically Held Transfer Provider, Automatic     03/13/2021 1000 metoprolol succinate (TOPROL-XL) 24 hr tablet 50 mg   Oral Automatically Held Transfer Provider, Automatic     03/12/2021 1451 metoprolol succinate (TOPROL-XL) 24 hr tablet 50 mg   Oral MAR Hold Transfer Provider, Automatic     03/12/2021 1451 morphine 2 MG/ML injection 1 mg   Intravenous MAR Hold Transfer Provider, Automatic     03/12/2021 1758 ondansetron (ZOFRAN) injection 4 mg Intravenous Given Irving Burton, CRNA     03/12/2021 1451 ondansetron (ZOFRAN) injection 4 mg   Intravenous MAR Hold Transfer Provider, Automatic     03/12/2021 1451 ondansetron (ZOFRAN) tablet 4 mg   Oral MAR Hold Transfer Provider, Automatic     03/12/2021 1810 phenylephrine (NEO-SYNEPHRINE) injection 50 mcg Intravenous Given Irving Burton, CRNA     03/12/2021 1752 phenylephrine (NEO-SYNEPHRINE) injection 50 mcg Intravenous Given Irving Burton, CRNA     03/12/2021 1742 phenylephrine (NEO-SYNEPHRINE) injection 100 mcg Intravenous Given Irving Burton, CRNA     03/12/2021 1728 phenylephrine (NEO-SYNEPHRINE) injection 100 mcg Intravenous Given Irving Burton, CRNA     03/12/2021 1725 phenylephrine (NEO-SYNEPHRINE) injection 200 mcg Intravenous Given Irving Burton, CRNA     03/20/2021 1000 polyethylene glycol (MIRALAX / GLYCOLAX) packet 17 g   Oral Automatically Held Armed forces technical officer, Automatic     03/19/2021 1000 polyethylene glycol (MIRALAX / GLYCOLAX) packet 17 g   Oral Automatically Held Transfer Provider, Automatic     03/18/2021 1000 polyethylene glycol (MIRALAX / GLYCOLAX) packet 17 g   Oral Automatically Solicitor, Automatic  03/17/2021 1000 polyethylene glycol (MIRALAX / GLYCOLAX) packet 17 g   Oral Automatically Held Location manager, Automatic     03/16/2021 1000 polyethylene glycol (MIRALAX / GLYCOLAX) packet 17 g   Oral Automatically Held Location manager, Automatic     03/15/2021 1000 polyethylene glycol (MIRALAX / GLYCOLAX) packet 17 g   Oral Automatically Held Location manager, Automatic     03/14/2021 1000 polyethylene glycol (MIRALAX / GLYCOLAX) packet 17 g   Oral Automatically Held Location manager, Automatic     03/13/2021 1000 polyethylene glycol (MIRALAX / GLYCOLAX) packet 17 g   Oral Automatically Held Transfer Provider, Automatic     03/12/2021 1451 polyethylene glycol (MIRALAX / GLYCOLAX) packet 17 g   Oral MAR Hold Transfer Provider, Automatic     03/12/2021 1724 propofol (DIPRIVAN) 10 mg/mL bolus/IV push 100 mg Intravenous Given Irving Burton, CRNA     03/25/2021 2200 QUEtiapine (SEROQUEL) tablet 25 mg   Oral Automatically Held Location manager, Automatic     03/24/2021 2200 QUEtiapine (SEROQUEL) tablet 25 mg   Oral Automatically Held Location manager, Automatic     03/23/2021 2200 QUEtiapine (SEROQUEL) tablet 25 mg   Oral Automatically Held Transfer Provider, Automatic     03/22/2021 2200 QUEtiapine (SEROQUEL) tablet 25 mg   Oral Automatically Held Transfer Provider, Automatic     03/21/2021 2200 QUEtiapine (SEROQUEL) tablet 25 mg   Oral Automatically Held Transfer Provider, Automatic     03/20/2021 2200 QUEtiapine (SEROQUEL) tablet 25 mg   Oral Automatically Held Transfer Provider, Automatic     03/19/2021 2200 QUEtiapine (SEROQUEL) tablet 25 mg   Oral Automatically Held Transfer Provider, Automatic     03/18/2021  2200 QUEtiapine (SEROQUEL) tablet 25 mg   Oral Automatically Held Transfer Provider, Automatic     03/17/2021 2200 QUEtiapine (SEROQUEL) tablet 25 mg   Oral Automatically Held Location manager, Automatic     03/16/2021 2200 QUEtiapine (SEROQUEL) tablet 25 mg   Oral Automatically Held Location manager, Automatic     03/15/2021 2200 QUEtiapine (SEROQUEL) tablet 25 mg   Oral Automatically Held Transfer Provider, Automatic     03/14/2021 2200 QUEtiapine (SEROQUEL) tablet 25 mg   Oral Automatically Held Transfer Provider, Automatic     03/13/2021 2200 QUEtiapine (SEROQUEL) tablet 25 mg   Oral Automatically Held Transfer Provider, Automatic     03/12/2021 2200 QUEtiapine (SEROQUEL) tablet 25 mg   Oral Automatically Held Transfer Provider, Automatic     03/12/2021 1451 QUEtiapine (SEROQUEL) tablet 25 mg   Oral MAR Hold Transfer Provider, Automatic     03/12/2021 1724 rocuronium (ZEMURON) injection 40 mg Intravenous Given Irving Burton, CRNA     03/12/2021 1818 sugammadex sodium (BRIDION) injection 100 mg Intravenous Given Irving Burton, CRNA       Mobility {Mobility:[id number]}

## 2021-03-12 NOTE — Op Note (Signed)
DATE OF SURGERY:  03/12/2021  TIME: 6:35 PM  PATIENT NAME:  Sara Conley  AGE: 87 y.o.  PRE-OPERATIVE DIAGNOSIS:  Left Hip Fracture  POST-OPERATIVE DIAGNOSIS:  SAME  PROCEDURE:  LEFT INTRAMEDULLARY (IM) NAIL FEMORAL  SURGEON:  Lyndle Herrlich  EBL:  50 cc  COMPLICATIONS:  None apparent  OPERATIVE IMPLANTS: Synthes trochanteric femoral nail  360 mm by 9 mm  with interlocking helical blade  95 mm  PREOPERATIVE INDICATIONS:  Sara Conley is a 87 y.o. year old who fell and suffered a hip fracture. She was brought into the ER and then admitted and optimized and then elected for surgical intervention.    The risks benefits and alternatives were discussed with the patient including but not limited to the risks of nonoperative treatment, versus surgical intervention including infection, bleeding, nerve injury, malunion, nonunion, hardware prominence, hardware failure, need for hardware removal, blood clots, cardiopulmonary complications, morbidity, mortality, among others, and they were willing to proceed.    OPERATIVE PROCEDURE:  The patient was brought to the operating room and placed in the supine position.  General anesthesia was administered, with a foley. She was placed on the fracture table.  Closed reduction was performed under C-arm guidance. The length of the femur was also measured using fluoroscopy. Time out was then performed after sterile prep and drape. She received preoperative antibiotics.  Incision was made proximal to the greater trochanter. A guidewire was placed in the appropriate position. Confirmation was made on AP and lateral views. The above-named nail was opened. I opened the proximal femur with a reamer. I then placed the nail by hand easily down. I did not need to ream the femur.  Once the nail was completely seated, I placed a guidepin into the femoral head into the center center position through a second incision.  I measured the length, and then reamed  the lateral cortex and up into the head. I then placed the helical blade. Slight compression was applied. Anatomic fixation achieved. Bone quality was poor.  I then secured the proximal interlock.  I then removed the instruments, and took final C-arm pictures AP and lateral the entire length of the leg. Anatomic reconstruction was achieved, and the wounds were irrigated copiously and closed with Vicryl  followed by staples and dry sterile dressing. Sponge and needle count were correct.   The patient was awakened and returned to PACU in stable and satisfactory condition. There no complications and the patient tolerated the procedure well.  She will be weightbearing as tolerated.    Lyndle Herrlich

## 2021-03-12 NOTE — Anesthesia Preprocedure Evaluation (Addendum)
Anesthesia Evaluation  Patient identified by MRN, date of birth, ID band Patient confused    Reviewed: Allergy & Precautions, NPO status , Patient's Chart, lab work & pertinent test results  History of Anesthesia Complications Negative for: history of anesthetic complications  Airway Mallampati: II       Dental   Pulmonary neg sleep apnea, neg COPD, Not current smoker,           Cardiovascular hypertension, Pt. on medications + CAD and + Cardiac Stents  (-) Past MI and (-) CHF (-) dysrhythmias + Valvular Problems/Murmurs AS      Neuro/Psych neg Seizures Anxiety Depression Dementia    GI/Hepatic Neg liver ROS, neg GERD  ,  Endo/Other  neg diabetes  Renal/GU negative Renal ROS     Musculoskeletal   Abdominal   Peds  Hematology   Anesthesia Other Findings   Reproductive/Obstetrics                            Anesthesia Physical Anesthesia Plan  ASA: III and emergent  Anesthesia Plan: General   Post-op Pain Management:    Induction:   PONV Risk Score and Plan:   Airway Management Planned: Oral ETT  Additional Equipment:   Intra-op Plan:   Post-operative Plan:   Informed Consent: I have reviewed the patients History and Physical, chart, labs and discussed the procedure including the risks, benefits and alternatives for the proposed anesthesia with the patient or authorized representative who has indicated his/her understanding and acceptance.       Plan Discussed with:   Anesthesia Plan Comments:         Anesthesia Quick Evaluation

## 2021-03-13 ENCOUNTER — Encounter: Payer: Self-pay | Admitting: Orthopedic Surgery

## 2021-03-13 DIAGNOSIS — F0391 Unspecified dementia with behavioral disturbance: Secondary | ICD-10-CM | POA: Diagnosis not present

## 2021-03-13 DIAGNOSIS — S72002A Fracture of unspecified part of neck of left femur, initial encounter for closed fracture: Secondary | ICD-10-CM | POA: Diagnosis not present

## 2021-03-13 DIAGNOSIS — N390 Urinary tract infection, site not specified: Secondary | ICD-10-CM | POA: Diagnosis not present

## 2021-03-13 LAB — CBC
HCT: 26.8 % — ABNORMAL LOW (ref 36.0–46.0)
Hemoglobin: 8.5 g/dL — ABNORMAL LOW (ref 12.0–15.0)
MCH: 31.5 pg (ref 26.0–34.0)
MCHC: 31.7 g/dL (ref 30.0–36.0)
MCV: 99.3 fL (ref 80.0–100.0)
Platelets: 158 10*3/uL (ref 150–400)
RBC: 2.7 MIL/uL — ABNORMAL LOW (ref 3.87–5.11)
RDW: 14.4 % (ref 11.5–15.5)
WBC: 11.2 10*3/uL — ABNORMAL HIGH (ref 4.0–10.5)
nRBC: 0 % (ref 0.0–0.2)

## 2021-03-13 LAB — BASIC METABOLIC PANEL
Anion gap: 13 (ref 5–15)
BUN: 49 mg/dL — ABNORMAL HIGH (ref 8–23)
CO2: 24 mmol/L (ref 22–32)
Calcium: 8.4 mg/dL — ABNORMAL LOW (ref 8.9–10.3)
Chloride: 99 mmol/L (ref 98–111)
Creatinine, Ser: 1.51 mg/dL — ABNORMAL HIGH (ref 0.44–1.00)
GFR, Estimated: 31 mL/min — ABNORMAL LOW (ref >60)
Glucose, Bld: 128 mg/dL — ABNORMAL HIGH (ref 70–99)
Potassium: 5.4 mmol/L — ABNORMAL HIGH (ref 3.5–5.1)
Sodium: 136 mmol/L (ref 135–145)

## 2021-03-13 LAB — POTASSIUM: Potassium: 4.7 mmol/L (ref 3.5–5.1)

## 2021-03-13 MED ORDER — SODIUM ZIRCONIUM CYCLOSILICATE 10 G PO PACK
10.0000 g | PACK | Freq: Once | ORAL | Status: AC
  Administered 2021-03-13: 10 g via ORAL
  Filled 2021-03-13: qty 1

## 2021-03-13 NOTE — Progress Notes (Signed)
PROGRESS NOTE    Sara Conley  YBF:[id number] DOB: 1918/08/08 DOA: 03/07/2021 PCP: Marisue Ivan, MD    HPI was taken from Dr. Mikeal Hawthorne:  Sara Conley is a 87 y.o. female with medical history significant of hypertension, hyperlipidemia, dementia, who only speaks [place] brought in by her daughter after sustaining a mechanical fall at home and having pain on her left side.  She tripped low while using her walker in the restroom.  She fell on her left side.  There was severe pain in the area and she called out to her daughter.  Patient was seen in the ER and has left intertrochanteric fracture.  Orthopedics consulted but patient is on Plavix.  Recommendation is for admission and getting patient ready for surgery probably in about 3 days.  She did not hit her head.  Patient is frail but has been apparently doing well in her usual state of health.  Able to do her ADLs according to the daughter.  She was using the walker but can move around the house.  She otherwise has no other complaints.  No other areas of injury..  ED Course: Temperature is 98.6 initial blood pressure 207/66 with pulse 59 respirate of 16 oxygen sat 87% on room air.  Currently 100%.  White count is 11.1 hemoglobin 10.7 platelets of 189.  Chemistry mostly within normal except BUN 35 calcium 8.7.  Glucose of 115.  Viral screen including COVID-19 is negative urinalysis showed WBC 16 with rare bacteria and positive nitrite.  Head CT without contrast showed no acute findings.  Chest x-ray showed no significant findings.  X-ray of the left hip showed comminuted intertrochanteric left hip fracture.  Patient being admitted to the hospital for further evaluation and treatment.   Hospital course from Dr. Mayford Knife 5/5-5/10/22: Pt presented w/ left hip fracture secondary to fall at home. Pt is s/p left intramedullary nail on 03/12/21 as per ortho surg. PT/OT should see the pt today. Pt's daughter is hopeful that the pt will go to SNF after  d/c. Also, pt has UTI and is on IV rocephin   Assessment & Plan:   Principal Problem:   left Hip fracture (HCC) Active Problems:   Anxiety and depression   Coronary artery disease involving native coronary artery of native heart without angina pectoris   Essential hypertension   Other insomnia   Pure hypercholesterolemia   Valvular heart disease   Acute lower UTI   Left intertrochanteric fracture: s/p mechanical fall. Restarted plavix today. S/p intramedullary nail on 03/12/21. Norco, morphine prn for pain   HTN: continue on metoprolol. Will hold home dose of ARB secondary to hyperkalemia  Hyperkalemia: likely secondary to ARB use. Lokelma x 1. Repeat K level ordered.   HLD: continue on statin   UTI: continue on IV rocephin. Urine cx is growing e. coli   Hx of CAD: continue on home dose of metoprolol,statin. Continue to hold ARB secondary to hyperkalemia. Restarted plavix today   Dementia: continue w/ supportive care. Continue on seroquel qhs   Thrombocytopenia: resolved   Macrocytic anemia: folate, B12 are WNL. H&H are stable   Leukocytosis: labile. Will continue to monitor    DVT prophylaxis: lovenox  Code Status: full  Family Communication: discussed pt's care w/ pt's daughter at bedside and answered her questions  Disposition Plan: depends on PT/OT recs (not consulted yet)   Level of care: Med-Surg   Status is: Inpatient  Remains inpatient appropriate because:Ongoing diagnostic testing needed not appropriate for outpatient work  up, Unsafe d/c plan, IV treatments appropriate due to intensity of illness or inability to take PO and Inpatient level of care appropriate due to severity of illness   Dispo: The patient is from: Home              Anticipated d/c is to: SNF              Patient currently is not medically stable to d/c.   Difficult to place patient: unclear     Consultants:   Ortho surg  Procedures:    Antimicrobials:   Subjective: Pt c/o  fatigue   Objective: Vitals:   03/12/21 2159 03/12/21 2254 03/12/21 2302 03/13/21 0100  BP: (!) 92/47 (!) 79/37 (!) 91/44 100/67  Pulse: 88 95 93   Resp: 15     Temp: 97.6 F (36.4 C)     TempSrc:      SpO2: 100%   100%  Weight:      Height:        Intake/Output Summary (Last 24 hours) at 03/13/2021 0716 Last data filed at 03/13/2021 0556 Gross per 24 hour  Intake --  Output 250 ml  Net -250 ml   Filed Weights   03/07/21 1717  Weight: 42.6 kg    Examination:  General exam: Frail appearing. Appears comfortable  Respiratory system: clear breath sounds b/l. No rales  Cardiovascular system: S1/S2+. No gallops or rubs  Gastrointestinal system: Abd is soft, NT, ND & normal bowel sounds  Central nervous system:  Moves all extremities  Psychiatry: judgement and insight appear abnormal. Flat mood and affect    Data Reviewed: I have personally reviewed following labs and imaging studies  CBC: Recent Labs  Lab 03/07/21 1859 03/09/21 0735 03/10/21 0812 03/11/21 0509 03/12/21 0453 03/12/21 2229 03/13/21 0620  WBC 11.1* 9.3 8.0 12.0* 11.8*  --  11.2*  NEUTROABS 8.6*  --   --   --   --   --   --   HGB 10.7* 8.7* 7.9* 8.2* 8.5* 8.7* 8.5*  HCT 32.6* 27.6* 25.1* 25.1* 26.2* 27.4* 26.8*  MCV 97.3 99.3 100.4* 97.7 99.2  --  99.3  PLT 189 131* 113* 129* 147*  --  158   Basic Metabolic Panel: Recent Labs  Lab 03/09/21 0735 03/10/21 0812 03/11/21 0509 03/12/21 0453 03/13/21 0620  NA 136 133* 133* 135 136  K 4.5 4.3 4.0 4.2 5.4*  CL 104 101 101 101 99  CO2 26 27 27 25 24   GLUCOSE 109* 83 107* 94 128*  BUN 38* 29* 18 25* 49*  CREATININE 0.97 0.73 0.59 0.80 1.51*  CALCIUM 8.4* 8.1* 8.2* 8.1* 8.4*   GFR: Estimated Creatinine Clearance: 12.8 mL/min (A) (by C-G formula based on SCr of 1.51 mg/dL (H)). Liver Function Tests: Recent Labs  Lab 03/07/21 2005 03/08/21 0614  AST 23 18  ALT 11 10  ALKPHOS 51 48  BILITOT 0.5 0.6  PROT 6.1* 5.7*  ALBUMIN 3.5 3.1*   No  results for input(s): LIPASE, AMYLASE in the last 168 hours. No results for input(s): AMMONIA in the last 168 hours. Coagulation Profile: No results for input(s): INR, PROTIME in the last 168 hours. Cardiac Enzymes: No results for input(s): CKTOTAL, CKMB, CKMBINDEX, TROPONINI in the last 168 hours. BNP (last 3 results) No results for input(s): PROBNP in the last 8760 hours. HbA1C: No results for input(s): HGBA1C in the last 72 hours. CBG: No results for input(s): GLUCAP in the last 168 hours. Lipid Profile:  No results for input(s): CHOL, HDL, LDLCALC, TRIG, CHOLHDL, LDLDIRECT in the last 72 hours. Thyroid Function Tests: No results for input(s): TSH, T4TOTAL, FREET4, T3FREE, THYROIDAB in the last 72 hours. Anemia Panel: Recent Labs    03/11/21 0509  VITAMINB12 720  FOLATE 13.2   Sepsis Labs: No results for input(s): PROCALCITON, LATICACIDVEN in the last 168 hours.  Recent Results (from the past 240 hour(s))  Resp Panel by RT-PCR (Flu A&B, Covid) Nasopharyngeal Swab     Status: None   Collection Time: 03/07/21  6:59 PM   Specimen: Nasopharyngeal Swab; Nasopharyngeal(NP) swabs in vial transport medium  Result Value Ref Range Status   SARS Coronavirus 2 by RT PCR NEGATIVE NEGATIVE Final    Comment: (NOTE) SARS-CoV-2 target nucleic acids are NOT DETECTED.  The SARS-CoV-2 RNA is generally detectable in upper respiratory specimens during the acute phase of infection. The lowest concentration of SARS-CoV-2 viral copies this assay can detect is 138 copies/mL. A negative result does not preclude SARS-Cov-2 infection and should not be used as the sole basis for treatment or other patient management decisions. A negative result may occur with  improper specimen collection/handling, submission of specimen other than nasopharyngeal swab, presence of viral mutation(s) within the areas targeted by this assay, and inadequate number of viral copies(<138 copies/mL). A negative result must  be combined with clinical observations, patient history, and epidemiological information. The expected result is Negative.  Fact Sheet for Patients:  BloggerCourse.comhttps://www.fda.gov/media/152166/download  Fact Sheet for Healthcare Providers:  SeriousBroker.ithttps://www.fda.gov/media/152162/download  This test is no t yet approved or cleared by the [place] FDA and  has been authorized for detection and/or diagnosis of SARS-CoV-2 by FDA under an Emergency Use Authorization (EUA). This EUA will remain  in effect (meaning this test can be used) for the duration of the COVID-19 declaration under Section 564(b)(1) of the Act, 21 U.S.C.section 360bbb-3(b)(1), unless the authorization is terminated  or revoked sooner.       Influenza A by PCR NEGATIVE NEGATIVE Final   Influenza B by PCR NEGATIVE NEGATIVE Final    Comment: (NOTE) The Xpert Xpress SARS-CoV-2/FLU/RSV plus assay is intended as an aid in the diagnosis of influenza from Nasopharyngeal swab specimens and should not be used as a sole basis for treatment. Nasal washings and aspirates are unacceptable for Xpert Xpress SARS-CoV-2/FLU/RSV testing.  Fact Sheet for Patients: BloggerCourse.comhttps://www.fda.gov/media/152166/download  Fact Sheet for Healthcare Providers: SeriousBroker.ithttps://www.fda.gov/media/152162/download  This test is not yet approved or cleared by the [place] FDA and has been authorized for detection and/or diagnosis of SARS-CoV-2 by FDA under an Emergency Use Authorization (EUA). This EUA will remain in effect (meaning this test can be used) for the duration of the COVID-19 declaration under Section 564(b)(1) of the Act, 21 U.S.C. section 360bbb-3(b)(1), unless the authorization is terminated or revoked.  Performed at [facility], [address] [place], [place] [address]   Urine culture     Status: Abnormal   Collection Time: 03/07/21  6:59 PM   Specimen: Urine, Random  Result Value Ref Range Status   Specimen Description   Final     URINE, RANDOM Performed at [facility], [address] [place], [place] [address]    Special Requests   Final    NONE Performed at [facility], [address] [place], [place] [address]    Culture >=100,000 COLONIES/mL ESCHERICHIA COLI (A)  Final   Report Status 03/10/2021 FINAL  Final   Organism ID, Bacteria ESCHERICHIA COLI (A)  Final      Susceptibility   Escherichia coli - MIC*    AMPICILLIN <=2 SENSITIVE Sensitive     CEFAZOLIN <=4 SENSITIVE Sensitive     CEFEPIME <=0.12 SENSITIVE Sensitive     CEFTRIAXONE <=0.25 SENSITIVE Sensitive     CIPROFLOXACIN <=0.25 SENSITIVE Sensitive     GENTAMICIN <=1 SENSITIVE Sensitive     IMIPENEM <=0.25 SENSITIVE Sensitive     NITROFURANTOIN <=16 SENSITIVE Sensitive     TRIMETH/SULFA <=20 SENSITIVE Sensitive     AMPICILLIN/SULBACTAM <=2 SENSITIVE Sensitive     PIP/TAZO <=4 SENSITIVE Sensitive     * >=100,000 COLONIES/mL ESCHERICHIA COLI         Radiology Studies: DG HIP OPERATIVE UNILAT W OR W/O PELVIS LEFT  Result Date: 03/12/2021 CLINICAL DATA:  Fracture fixation. EXAM: OPERATIVE LEFT HIP (WITH PELVIS IF PERFORMED) TECHNIQUE: Fluoroscopic spot image(s) were submitted for interpretation post-operatively. COMPARISON:  Preoperative radiograph 03/07/2021 FINDINGS: Three fluoroscopic spot views of the left hip and femur obtained in the operating room. Intramedullary nail with trans trochanteric screw fixation traversing intertrochanteric femur fracture. Fluoroscopy time 22 seconds. IMPRESSION: Procedural fluoroscopy during intertrochanteric femur fracture fixation. Electronically Signed   By: Narda Rutherford M.D.   On: 03/12/2021 18:21        Scheduled Meds: . atorvastatin  40 mg Oral QHS  . Chlorhexidine Gluconate Cloth  6 each Topical Daily  . clopidogrel  75 mg Oral Daily  . docusate sodium  100 mg Oral BID  . doxepin  10 mg Oral QHS  . irbesartan  300 mg Oral Daily  . metoprolol succinate  50 mg Oral  Daily  . polyethylene glycol  17 g Oral Daily  . QUEtiapine  25 mg Oral QHS  . sodium zirconium cyclosilicate  10 g Oral Once   Continuous Infusions:    LOS: 6 days    Time spent: 30 mins    Charise Killian, MD [facility] Pager [phone]  If 7PM-7AM, please contact night-coverage 03/13/2021, 7:16 AM

## 2021-03-13 NOTE — Plan of Care (Signed)

## 2021-03-13 NOTE — Progress Notes (Signed)
  Subjective:  Patient reports pain as mild.    Objective:   VITALS:   Vitals:   03/12/21 2302 03/13/21 0100 03/13/21 0747 03/13/21 1132  BP: (!) 91/44 100/67 (!) 115/50 (!) 103/49  Pulse: 93  94 93  Resp:   16 16  Temp:   97.6 F (36.4 C) 98 F (36.7 C)  TempSrc:   Oral Oral  SpO2:  100% 96% 93%  Weight:      Height:        PHYSICAL EXAM:  Neurologically intact ABD soft Neurovascular intact Sensation intact distally Intact pulses distally Dorsiflexion/Plantar flexion intact Incision: dressing C/D/I No cellulitis present Compartment soft  LABS  Results for orders placed or performed during the hospital encounter of 03/07/21 (from the past 24 hour(s))  Hemoglobin and hematocrit, blood     Status: Abnormal   Collection Time: 03/12/21 10:29 PM  Result Value Ref Range   Hemoglobin 8.7 (L) 12.0 - 15.0 g/dL   HCT 16.1 (L) 09.6 - 04.5 %  CBC     Status: Abnormal   Collection Time: 03/13/21  6:20 AM  Result Value Ref Range   WBC 11.2 (H) 4.0 - 10.5 K/uL   RBC 2.70 (L) 3.87 - 5.11 MIL/uL   Hemoglobin 8.5 (L) 12.0 - 15.0 g/dL   HCT 40.9 (L) 81.1 - 91.4 %   MCV 99.3 80.0 - 100.0 fL   MCH 31.5 26.0 - 34.0 pg   MCHC 31.7 30.0 - 36.0 g/dL   RDW 78.2 95.6 - 21.3 %   Platelets 158 150 - 400 K/uL   nRBC 0.0 0.0 - 0.2 %  Basic metabolic panel     Status: Abnormal   Collection Time: 03/13/21  6:20 AM  Result Value Ref Range   Sodium 136 135 - 145 mmol/L   Potassium 5.4 (H) 3.5 - 5.1 mmol/L   Chloride 99 98 - 111 mmol/L   CO2 24 22 - 32 mmol/L   Glucose, Bld 128 (H) 70 - 99 mg/dL   BUN 49 (H) 8 - 23 mg/dL   Creatinine, Ser 0.86 (H) 0.44 - 1.00 mg/dL   Calcium 8.4 (L) 8.9 - 10.3 mg/dL   GFR, Estimated 31 (L) >60 mL/min   Anion gap 13 5 - 15    DG HIP OPERATIVE UNILAT W OR W/O PELVIS LEFT  Result Date: 03/12/2021 CLINICAL DATA:  Fracture fixation. EXAM: OPERATIVE LEFT HIP (WITH PELVIS IF PERFORMED) TECHNIQUE: Fluoroscopic spot image(s) were submitted for interpretation  post-operatively. COMPARISON:  Preoperative radiograph 03/07/2021 FINDINGS: Three fluoroscopic spot views of the left hip and femur obtained in the operating room. Intramedullary nail with trans trochanteric screw fixation traversing intertrochanteric femur fracture. Fluoroscopy time 22 seconds. IMPRESSION: Procedural fluoroscopy during intertrochanteric femur fracture fixation. Electronically Signed   By: Narda Rutherford M.D.   On: 03/12/2021 18:21    Assessment/Plan: 1 Day Post-Op   Principal Problem:   left Hip fracture (HCC) Active Problems:   Anxiety and depression   Coronary artery disease involving native coronary artery of native heart without angina pectoris   Essential hypertension   Other insomnia   Pure hypercholesterolemia   Valvular heart disease   Acute lower UTI   Advance diet Up with therapy WBAT LLE Discharge per medicine SNF Continue home Plavix Follow up in 2 weeks for staple removal call office to confirm appt [phone]   Sara Conley , PA-C 03/13/2021, 12:13 PM

## 2021-03-13 NOTE — Plan of Care (Signed)
Problem: Education: Goal: Knowledge of General Education information will improve Description: Including pain rating scale, medication(s)/side effects and non-pharmacologic comfort measures 03/13/2021 1402 by Pierce Crane, RN Outcome: Progressing 03/13/2021 1036 by Pierce Crane, RN Outcome: Progressing   Problem: Health Behavior/Discharge Planning: Goal: Ability to manage health-related needs will improve 03/13/2021 1402 by Pierce Crane, RN Outcome: Progressing 03/13/2021 1036 by Pierce Crane, RN Outcome: Progressing   Problem: Clinical Measurements: Goal: Ability to maintain clinical measurements within normal limits will improve 03/13/2021 1402 by Pierce Crane, RN Outcome: Progressing 03/13/2021 1036 by Pierce Crane, RN Outcome: Progressing Goal: Will remain free from infection 03/13/2021 1402 by Pierce Crane, RN Outcome: Progressing 03/13/2021 1036 by Pierce Crane, RN Outcome: Progressing Goal: Diagnostic test results will improve 03/13/2021 1402 by Pierce Crane, RN Outcome: Progressing 03/13/2021 1036 by Pierce Crane, RN Outcome: Progressing Goal: Respiratory complications will improve 03/13/2021 1402 by Pierce Crane, RN Outcome: Progressing 03/13/2021 1036 by Pierce Crane, RN Outcome: Progressing Goal: Cardiovascular complication will be avoided 03/13/2021 1402 by Pierce Crane, RN Outcome: Progressing 03/13/2021 1036 by Pierce Crane, RN Outcome: Progressing   Problem: Activity: Goal: Risk for activity intolerance will decrease 03/13/2021 1402 by Pierce Crane, RN Outcome: Progressing 03/13/2021 1036 by Pierce Crane, RN Outcome: Progressing   Problem: Nutrition: Goal: Adequate nutrition will be maintained 03/13/2021 1402 by Pierce Crane, RN Outcome: Progressing 03/13/2021 1036 by Pierce Crane, RN Outcome: Progressing   Problem: Coping: Goal: Level of anxiety will decrease 03/13/2021  1402 by Pierce Crane, RN Outcome: Progressing 03/13/2021 1036 by Pierce Crane, RN Outcome: Progressing   Problem: Elimination: Goal: Will not experience complications related to bowel motility 03/13/2021 1402 by Pierce Crane, RN Outcome: Progressing 03/13/2021 1036 by Pierce Crane, RN Outcome: Progressing Goal: Will not experience complications related to urinary retention 03/13/2021 1402 by Pierce Crane, RN Outcome: Progressing 03/13/2021 1036 by Pierce Crane, RN Outcome: Progressing   Problem: Pain Managment: Goal: General experience of comfort will improve 03/13/2021 1402 by Pierce Crane, RN Outcome: Progressing 03/13/2021 1036 by Pierce Crane, RN Outcome: Progressing   Problem: Safety: Goal: Ability to remain free from injury will improve 03/13/2021 1402 by Pierce Crane, RN Outcome: Progressing 03/13/2021 1036 by Pierce Crane, RN Outcome: Progressing   Problem: Skin Integrity: Goal: Risk for impaired skin integrity will decrease 03/13/2021 1402 by Pierce Crane, RN Outcome: Progressing 03/13/2021 1036 by Pierce Crane, RN Outcome: Progressing   Problem: Education: Goal: Verbalization of understanding the information provided (i.e., activity precautions, restrictions, etc) will improve 03/13/2021 1402 by Pierce Crane, RN Outcome: Progressing 03/13/2021 1036 by Pierce Crane, RN Outcome: Progressing Goal: Individualized Educational Video(s) 03/13/2021 1402 by Pierce Crane, RN Outcome: Progressing 03/13/2021 1036 by Pierce Crane, RN Outcome: Progressing   Problem: Activity: Goal: Ability to ambulate and perform ADLs will improve 03/13/2021 1402 by Pierce Crane, RN Outcome: Progressing 03/13/2021 1036 by Pierce Crane, RN Outcome: Progressing   Problem: Clinical Measurements: Goal: Postoperative complications will be avoided or minimized 03/13/2021 1402 by Pierce Crane, RN Outcome:  Progressing 03/13/2021 1036 by Pierce Crane, RN Outcome: Progressing   Problem: Self-Concept: Goal: Ability to maintain and perform role responsibilities to the fullest extent possible will improve 03/13/2021 1402 by Pierce Crane, RN Outcome: Progressing 03/13/2021 1036 by Pierce Crane, RN Outcome: Progressing   Problem: Pain Management: Goal:  Pain level will decrease 03/13/2021 1402 by Pierce Crane, RN Outcome: Progressing 03/13/2021 1036 by Pierce Crane, RN Outcome: Progressing

## 2021-03-13 NOTE — Progress Notes (Signed)
PT Cancellation Note  Patient Details Name: Sara Conley MRN: [id number] DOB: 1918/07/25   Cancelled Treatment:    Reason Eval/Treat Not Completed: Patient not medically ready.  PT consult received.  Chart reviewed.  Pt's potassium noted to be elevated to 5.4 this morning.  Per PT guidelines for elevated potassium, exertional activity currently contraindicated.  Will hold PT at this time and re-attempt PT evaluation at a later date/time as medically appropriate.  Hendricks Limes, PT 03/13/21, 8:35 AM

## 2021-03-14 DIAGNOSIS — I2510 Atherosclerotic heart disease of native coronary artery without angina pectoris: Secondary | ICD-10-CM | POA: Diagnosis not present

## 2021-03-14 DIAGNOSIS — F419 Anxiety disorder, unspecified: Secondary | ICD-10-CM | POA: Diagnosis not present

## 2021-03-14 DIAGNOSIS — S72002A Fracture of unspecified part of neck of left femur, initial encounter for closed fracture: Secondary | ICD-10-CM | POA: Diagnosis not present

## 2021-03-14 DIAGNOSIS — N390 Urinary tract infection, site not specified: Secondary | ICD-10-CM | POA: Diagnosis not present

## 2021-03-14 LAB — BASIC METABOLIC PANEL
Anion gap: 8 (ref 5–15)
BUN: 67 mg/dL — ABNORMAL HIGH (ref 8–23)
CO2: 26 mmol/L (ref 22–32)
Calcium: 8 mg/dL — ABNORMAL LOW (ref 8.9–10.3)
Chloride: 101 mmol/L (ref 98–111)
Creatinine, Ser: 1.75 mg/dL — ABNORMAL HIGH (ref 0.44–1.00)
GFR, Estimated: 26 mL/min — ABNORMAL LOW (ref >60)
Glucose, Bld: 107 mg/dL — ABNORMAL HIGH (ref 70–99)
Potassium: 4.5 mmol/L (ref 3.5–5.1)
Sodium: 135 mmol/L (ref 135–145)

## 2021-03-14 LAB — CBC
HCT: 21.7 % — ABNORMAL LOW (ref 36.0–46.0)
Hemoglobin: 7.2 g/dL — ABNORMAL LOW (ref 12.0–15.0)
MCH: 32.1 pg (ref 26.0–34.0)
MCHC: 33.2 g/dL (ref 30.0–36.0)
MCV: 96.9 fL (ref 80.0–100.0)
Platelets: 153 10*3/uL (ref 150–400)
RBC: 2.24 MIL/uL — ABNORMAL LOW (ref 3.87–5.11)
RDW: 14.3 % (ref 11.5–15.5)
WBC: 10.2 10*3/uL (ref 4.0–10.5)
nRBC: 0 % (ref 0.0–0.2)

## 2021-03-14 MED ORDER — BISACODYL 10 MG RE SUPP
10.0000 mg | Freq: Every day | RECTAL | Status: DC
  Administered 2021-03-14 – 2021-03-15 (×2): 10 mg via RECTAL
  Filled 2021-03-14 (×9): qty 1

## 2021-03-14 MED ORDER — HYDROCODONE-ACETAMINOPHEN 5-325 MG PO TABS: 1.0000 | ORAL_TABLET | Freq: Four times a day (QID) | ORAL | 0 refills | Status: AC | PRN

## 2021-03-14 NOTE — [place] FL2 (Signed)
[place] MEDICAID FL2 LEVEL OF CARE SCREENING TOOL     IDENTIFICATION  Patient Name: Sara Conley Birthdate: 18-Feb-1918 Sex: female Admission Date (Current Location): 03/07/2021  [place] and [place] Number:  Chiropodist and Address:  [facility], [address] [place], [place] [address]      Provider Number: [id number]  Attending Physician Name and Address:  Delfino Lovett, MD  Relative Name and Phone Number:  Rico Ala daughter [phone]    Current Level of Care: Hospital Recommended Level of Care: Skilled Nursing Facility Prior Approval Number:    Date Approved/Denied:   PASRR Number: [id number]  Discharge Plan: SNF    Current Diagnoses: Patient Active Problem List   Diagnosis Date Noted  . left Hip fracture (HCC) 03/07/2021  . Acute lower UTI 03/07/2021  . Anxiety and depression 08/17/2020  . Valvular heart disease 11/22/2019  . Coronary artery disease involving native coronary artery of native heart without angina pectoris 11/11/2019  . Essential hypertension 11/11/2019  . Other insomnia 11/11/2019  . Pure hypercholesterolemia 11/11/2019    Orientation RESPIRATION BLADDER Height & Weight     Self,Time,Situation,Place  Normal Continent Weight: 42.6 kg Height:  4\' 10"  (147.3 cm)  BEHAVIORAL SYMPTOMS/MOOD NEUROLOGICAL BOWEL NUTRITION STATUS      Continent Diet (regular)  AMBULATORY STATUS COMMUNICATION OF NEEDS Skin   Extensive Assist Verbally Surgical wounds                       Personal Care Assistance Level of Assistance  Bathing,Dressing Bathing Assistance: Limited assistance   Dressing Assistance: Limited assistance     Functional Limitations Info             SPECIAL CARE FACTORS FREQUENCY  PT (By licensed PT)     PT Frequency: 5 times per week              Contractures Contractures Info: Not present    Additional Factors Info  Code Status,Allergies Code Status Info: DNR Allergies  Info: NKDA           Current Medications (03/14/2021):  This is the current hospital active medication list Current Facility-Administered Medications  Medication Dose Route Frequency Provider Last Rate Last Admin  . acetaminophen (TYLENOL) tablet 650 mg  650 mg Oral Q6H PRN 05/14/2021, MD       Or  . acetaminophen (TYLENOL) suppository 650 mg  650 mg Rectal Q6H PRN Lyndle Herrlich, MD      . ALPRAZolam Lyndle Herrlich) tablet 0.25 mg  0.25 mg Oral TID PRN Prudy Feeler, MD   0.25 mg at 03/10/21 1542  . atorvastatin (LIPITOR) tablet 40 mg  40 mg Oral QHS 05/10/21, MD   40 mg at 03/13/21 2101  . bisacodyl (DULCOLAX) suppository 10 mg  10 mg Rectal Daily 2102, MD   10 mg at 03/14/21 0854  . Chlorhexidine Gluconate Cloth 2 % PADS 6 each  6 each Topical Daily 05/14/21, MD   6 each at 03/13/21 1000  . cloNIDine (CATAPRES) tablet 0.1 mg  0.1 mg Oral Q6H PRN 05/13/21, MD      . clopidogrel (PLAVIX) tablet 75 mg  75 mg Oral Daily Lyndle Herrlich, MD   75 mg at 03/14/21 0853  . docusate sodium (COLACE) capsule 100 mg  100 mg Oral BID 05/14/21, MD   100 mg at 03/13/21 2107  . doxepin (SINEQUAN) capsule 10 mg  10 mg Oral QHS Lyndle Herrlich, MD   10 mg at 03/13/21 2107  . haloperidol lactate (HALDOL) injection 1 mg  1 mg Intravenous Q6H PRN Lyndle Herrlich, MD   1 mg at 03/10/21 1351  . HYDROcodone-acetaminophen (NORCO/VICODIN) 5-325 MG per tablet 1 tablet  1 tablet Oral Q6H PRN Lyndle Herrlich, MD   1 tablet at 03/08/21 1715  . LORazepam (ATIVAN) injection 0.5 mg  0.5 mg Intravenous Q4H PRN Lyndle Herrlich, MD   0.5 mg at 03/13/21 0103  . LORazepam (ATIVAN) tablet 0.5 mg  0.5 mg Oral Q4H PRN Lyndle Herrlich, MD   0.5 mg at 03/13/21 2101  . metoCLOPramide (REGLAN) tablet 5-10 mg  5-10 mg Oral Q8H PRN Lyndle Herrlich, MD       Or  . metoCLOPramide (REGLAN) injection 5-10 mg  5-10 mg Intravenous Q8H PRN Lyndle Herrlich, MD      . metoprolol succinate (TOPROL-XL) 24 hr  tablet 50 mg  50 mg Oral Daily Lyndle Herrlich, MD   50 mg at 03/09/21 0936  . morphine 2 MG/ML injection 1 mg  1 mg Intravenous Q4H PRN Lyndle Herrlich, MD   1 mg at 03/11/21 2000  . ondansetron (ZOFRAN) tablet 4 mg  4 mg Oral Q6H PRN Lyndle Herrlich, MD       Or  . ondansetron [facility]) injection 4 mg  4 mg Intravenous Q6H PRN Lyndle Herrlich, MD      . polyethylene glycol (MIRALAX / GLYCOLAX) packet 17 g  17 g Oral Daily Lyndle Herrlich, MD   17 g at 03/14/21 0851  . QUEtiapine (SEROQUEL) tablet 25 mg  25 mg Oral QHS Lyndle Herrlich, MD   25 mg at 03/13/21 2101     Discharge Medications: Please see discharge summary for a list of discharge medications.  Relevant Imaging Results:  Relevant Lab Results:   Additional Information SS# [id number] Fully vaccinated for Covid  Barrie Dunker, RN

## 2021-03-14 NOTE — TOC Progression Note (Signed)
Transition of Care [facility]) - Progression Note    Patient Details  Name: Sara Conley MRN: [id number] Date of Birth: [birth year]  Transition of [facility]) CM/SW Contact  Barrie Dunker, RN Phone Number: 03/14/2021, 4:17 PM  Clinical Narrative:   The daughter called back and stated they would like to go to [facility] I called [facility] and requested auth and provided the facility         Expected Discharge Plan and Services                                                 Social Determinants of Health (SDOH) Interventions    Readmission Risk Interventions No flowsheet data found.

## 2021-03-14 NOTE — Progress Notes (Signed)
PROGRESS NOTE    Sara Conley  TWS:[id number] DOB: 08/21/1918 DOA: 03/07/2021 PCP: Marisue Ivan, MD    HPI was taken from Dr. Mikeal Hawthorne:  Sara Conley is a 87 y.o. female with medical history significant of hypertension, hyperlipidemia, dementia, who only speaks [place] brought in by her daughter after sustaining a mechanical fall at home and having pain on her left side.  She tripped low while using her walker in the restroom.  She fell on her left side.  There was severe pain in the area and she called out to her daughter.  Patient was seen in the ER and has left intertrochanteric fracture.  Orthopedics consulted but patient is on Plavix.  Recommendation is for admission and getting patient ready for surgery probably in about 3 days.  She did not hit her head.  Patient is frail but has been apparently doing well in her usual state of health.  Able to do her ADLs according to the daughter.  She was using the walker but can move around the house.  She otherwise has no other complaints.  No other areas of injury..  ED Course: Temperature is 98.6 initial blood pressure 207/66 with pulse 59 respirate of 16 oxygen sat 87% on room air.  Currently 100%.  White count is 11.1 hemoglobin 10.7 platelets of 189.  Chemistry mostly within normal except BUN 35 calcium 8.7.  Glucose of 115.  Viral screen including COVID-19 is negative urinalysis showed WBC 16 with rare bacteria and positive nitrite.  Head CT without contrast showed no acute findings.  Chest x-ray showed no significant findings.  X-ray of the left hip showed comminuted intertrochanteric left hip fracture.  Patient being admitted to the hospital for further evaluation and treatment.   Hospital course from Dr. Mayford Knife 5/5-5/10/22: Pt presented w/ left hip fracture secondary to fall at home. Pt is s/p left intramedullary nail on 03/12/21 as per ortho surg. PT/OT should see the pt today. Pt's daughter is hopeful that the pt will go to SNF after  d/c. Also, pt has UTI and is on IV rocephin   Assessment & Plan:   Principal Problem:   left Hip fracture (HCC) Active Problems:   Anxiety and depression   Coronary artery disease involving native coronary artery of native heart without angina pectoris   Essential hypertension   Other insomnia   Pure hypercholesterolemia   Valvular heart disease   Acute lower UTI   Left intertrochanteric fracture: s/p mechanical fall. Restarted plavix. S/p intramedullary nail on 03/12/21. Norco, morphine prn for pain   HTN: continue on metoprolol. Will hold home dose of ARB secondary to hyperkalemia  Hyperkalemia: likely secondary to ARB use. Lokelma x 1. Repeat K level ordered.   HLD: continue on statin   E. Coli UTI: completed course of Abx. Urine cx is growing e. coli   Hx of CAD: continue on home dose of metoprolol,statin. Continue to hold ARB secondary to hyperkalemia. Restarted plavix    Dementia: continue w/ supportive care. Continue on seroquel qhs   Thrombocytopenia: resolved   Macrocytic anemia: folate, B12 are WNL. H&H are stable   Leukocytosis: labile. Will continue to monitor    DVT prophylaxis: lovenox  Code Status: full  Family Communication: discussed pt's care w/ pt's daughter at bedside and answered her questions on 5/11 Disposition Plan: SNF  Level of care: Med-Surg   Status is: Inpatient  Remains inpatient appropriate because:Ongoing diagnostic testing needed not appropriate for outpatient work up, Unsafe d/c plan, IV  treatments appropriate due to intensity of illness or inability to take PO and Inpatient level of care appropriate due to severity of illness   Dispo: The patient is from: Home              Anticipated d/c is to: SNF              Patient currently is not medically stable to d/c.   Difficult to place patient: unclear     Consultants:   Ortho surg  Procedures:  Hip surgery  Antimicrobials:   Subjective: No BM yet, hungry and ready to eat.  Wants to work with therapy, daughter at bedside  Objective: Vitals:   03/13/21 2036 03/14/21 0522 03/14/21 0752 03/14/21 1111  BP: 120/62 (!) 112/55 (!) 125/50 (!) 131/49  Pulse: (!) 101 75 77 72  Resp: 16 20 16 16   Temp: 97.7 F (36.5 C) (!) 97.5 F (36.4 C) 97.8 F (36.6 C) 97.7 F (36.5 C)  TempSrc: Oral Oral Oral Oral  SpO2: 92% 92% 92% 100%  Weight:      Height:        Intake/Output Summary (Last 24 hours) at 03/14/2021 1122 Last data filed at 03/14/2021 1021 Gross per 24 hour  Intake 0 ml  Output 0 ml  Net 0 ml   Filed Weights   03/07/21 1717  Weight: 42.6 kg    Examination:  General exam: Frail appearing. Appears comfortable  Respiratory system: clear breath sounds b/l. No rales  Cardiovascular system: S1/S2+. No gallops or rubs  Gastrointestinal system: Abd is soft, NT, ND & normal bowel sounds  Central nervous system:  Moves all extremities  Psychiatry: judgement and insight appear abnormal. Flat mood and affect    Data Reviewed: I have personally reviewed following labs and imaging studies  CBC: Recent Labs  Lab 03/07/21 1859 03/09/21 0735 03/10/21 0812 03/11/21 0509 03/12/21 0453 03/12/21 2229 03/13/21 0620 03/14/21 0539  WBC 11.1*   < > 8.0 12.0* 11.8*  --  11.2* 10.2  NEUTROABS 8.6*  --   --   --   --   --   --   --   HGB 10.7*   < > 7.9* 8.2* 8.5* 8.7* 8.5* 7.2*  HCT 32.6*   < > 25.1* 25.1* 26.2* 27.4* 26.8* 21.7*  MCV 97.3   < > 100.4* 97.7 99.2  --  99.3 96.9  PLT 189   < > 113* 129* 147*  --  158 153   < > = values in this interval not displayed.   Basic Metabolic Panel: Recent Labs  Lab 03/10/21 0812 03/11/21 0509 03/12/21 0453 03/13/21 0620 03/13/21 1617 03/14/21 0539  NA 133* 133* 135 136  --  135  K 4.3 4.0 4.2 5.4* 4.7 4.5  CL 101 101 101 99  --  101  CO2 27 27 25 24   --  26  GLUCOSE 83 107* 94 128*  --  107*  BUN 29* 18 25* 49*  --  67*  CREATININE 0.73 0.59 0.80 1.51*  --  1.75*  CALCIUM 8.1* 8.2* 8.1* 8.4*  --  8.0*    GFR: Estimated Creatinine Clearance: 11 mL/min (A) (by C-G formula based on SCr of 1.75 mg/dL (H)). Liver Function Tests: Recent Labs  Lab 03/07/21 2005 03/08/21 0614  AST 23 18  ALT 11 10  ALKPHOS 51 48  BILITOT 0.5 0.6  PROT 6.1* 5.7*  ALBUMIN 3.5 3.1*   No results for input(s): LIPASE, AMYLASE in  the last 168 hours. No results for input(s): AMMONIA in the last 168 hours. Coagulation Profile: No results for input(s): INR, PROTIME in the last 168 hours. Cardiac Enzymes: No results for input(s): CKTOTAL, CKMB, CKMBINDEX, TROPONINI in the last 168 hours. BNP (last 3 results) No results for input(s): PROBNP in the last 8760 hours. HbA1C: No results for input(s): HGBA1C in the last 72 hours. CBG: No results for input(s): GLUCAP in the last 168 hours. Lipid Profile: No results for input(s): CHOL, HDL, LDLCALC, TRIG, CHOLHDL, LDLDIRECT in the last 72 hours. Thyroid Function Tests: No results for input(s): TSH, T4TOTAL, FREET4, T3FREE, THYROIDAB in the last 72 hours. Anemia Panel: No results for input(s): VITAMINB12, FOLATE, FERRITIN, TIBC, IRON, RETICCTPCT in the last 72 hours. Sepsis Labs: No results for input(s): PROCALCITON, LATICACIDVEN in the last 168 hours.  Recent Results (from the past 240 hour(s))  Resp Panel by RT-PCR (Flu A&B, Covid) Nasopharyngeal Swab     Status: None   Collection Time: 03/07/21  6:59 PM   Specimen: Nasopharyngeal Swab; Nasopharyngeal(NP) swabs in vial transport medium  Result Value Ref Range Status   SARS Coronavirus 2 by RT PCR NEGATIVE NEGATIVE Final    Comment: (NOTE) SARS-CoV-2 target nucleic acids are NOT DETECTED.  The SARS-CoV-2 RNA is generally detectable in upper respiratory specimens during the acute phase of infection. The lowest concentration of SARS-CoV-2 viral copies this assay can detect is 138 copies/mL. A negative result does not preclude SARS-Cov-2 infection and should not be used as the sole basis for treatment or other  patient management decisions. A negative result may occur with  improper specimen collection/handling, submission of specimen other than nasopharyngeal swab, presence of viral mutation(s) within the areas targeted by this assay, and inadequate number of viral copies(<138 copies/mL). A negative result must be combined with clinical observations, patient history, and epidemiological information. The expected result is Negative.  Fact Sheet for Patients:  BloggerCourse.comhttps://www.fda.gov/media/152166/download  Fact Sheet for Healthcare Providers:  SeriousBroker.ithttps://www.fda.gov/media/152162/download  This test is no t yet approved or cleared by the [place] FDA and  has been authorized for detection and/or diagnosis of SARS-CoV-2 by FDA under an Emergency Use Authorization (EUA). This EUA will remain  in effect (meaning this test can be used) for the duration of the COVID-19 declaration under Section 564(b)(1) of the Act, 21 U.S.C.section 360bbb-3(b)(1), unless the authorization is terminated  or revoked sooner.       Influenza A by PCR NEGATIVE NEGATIVE Final   Influenza B by PCR NEGATIVE NEGATIVE Final    Comment: (NOTE) The Xpert Xpress SARS-CoV-2/FLU/RSV plus assay is intended as an aid in the diagnosis of influenza from Nasopharyngeal swab specimens and should not be used as a sole basis for treatment. Nasal washings and aspirates are unacceptable for Xpert Xpress SARS-CoV-2/FLU/RSV testing.  Fact Sheet for Patients: BloggerCourse.comhttps://www.fda.gov/media/152166/download  Fact Sheet for Healthcare Providers: SeriousBroker.ithttps://www.fda.gov/media/152162/download  This test is not yet approved or cleared by the [place] FDA and has been authorized for detection and/or diagnosis of SARS-CoV-2 by FDA under an Emergency Use Authorization (EUA). This EUA will remain in effect (meaning this test can be used) for the duration of the COVID-19 declaration under Section 564(b)(1) of the Act, 21 U.S.C. section  360bbb-3(b)(1), unless the authorization is terminated or revoked.  Performed at [facility], [address] [place], [place] [address]   Urine culture     Status: Abnormal   Collection Time: 03/07/21  6:59 PM   Specimen: Urine, Random  Result  Value Ref Range Status   Specimen Description   Final    URINE, RANDOM Performed at [facility], [address] [place], [place] [address]    Special Requests   Final    NONE Performed at [facility], [address] [place], [place] [address]    Culture >=100,000 COLONIES/mL ESCHERICHIA COLI (A)  Final   Report Status 03/10/2021 FINAL  Final   Organism ID, Bacteria ESCHERICHIA COLI (A)  Final      Susceptibility   Escherichia coli - MIC*    AMPICILLIN <=2 SENSITIVE Sensitive     CEFAZOLIN <=4 SENSITIVE Sensitive     CEFEPIME <=0.12 SENSITIVE Sensitive     CEFTRIAXONE <=0.25 SENSITIVE Sensitive     CIPROFLOXACIN <=0.25 SENSITIVE Sensitive     GENTAMICIN <=1 SENSITIVE Sensitive     IMIPENEM <=0.25 SENSITIVE Sensitive     NITROFURANTOIN <=16 SENSITIVE Sensitive     TRIMETH/SULFA <=20 SENSITIVE Sensitive     AMPICILLIN/SULBACTAM <=2 SENSITIVE Sensitive     PIP/TAZO <=4 SENSITIVE Sensitive     * >=100,000 COLONIES/mL ESCHERICHIA COLI         Radiology Studies: DG HIP OPERATIVE UNILAT W OR W/O PELVIS LEFT  Result Date: 03/12/2021 CLINICAL DATA:  Fracture fixation. EXAM: OPERATIVE LEFT HIP (WITH PELVIS IF PERFORMED) TECHNIQUE: Fluoroscopic spot image(s) were submitted for interpretation post-operatively. COMPARISON:  Preoperative radiograph 03/07/2021 FINDINGS: Three fluoroscopic spot views of the left hip and femur obtained in the operating room. Intramedullary nail with trans trochanteric screw fixation traversing intertrochanteric femur fracture. Fluoroscopy time 22 seconds. IMPRESSION: Procedural fluoroscopy during intertrochanteric femur fracture fixation. Electronically Signed   By: Narda Rutherford  M.D.   On: 03/12/2021 18:21        Scheduled Meds: . atorvastatin  40 mg Oral QHS  . bisacodyl  10 mg Rectal Daily  . Chlorhexidine Gluconate Cloth  6 each Topical Daily  . clopidogrel  75 mg Oral Daily  . docusate sodium  100 mg Oral BID  . doxepin  10 mg Oral QHS  . metoprolol succinate  50 mg Oral Daily  . polyethylene glycol  17 g Oral Daily  . QUEtiapine  25 mg Oral QHS   Continuous Infusions:    LOS: 7 days    Time spent: 30 mins    Delfino Lovett, MD [facility] Pager [phone]  If 7PM-7AM, please contact night-coverage 03/14/2021, 11:22 AM

## 2021-03-14 NOTE — Progress Notes (Signed)
  Subjective:  Patient reports pain as mild.    Objective:   VITALS:   Vitals:   03/13/21 1132 03/13/21 1514 03/13/21 2036 03/14/21 0522  BP: (!) 103/49 (!) 121/51 120/62 (!) 112/55  Pulse: 93 91 (!) 101 75  Resp: 16 16 16 20   Temp: 98 F (36.7 C) 97.7 F (36.5 C) 97.7 F (36.5 C) (!) 97.5 F (36.4 C)  TempSrc: Oral Oral Oral Oral  SpO2: 93% 95% 92% 92%  Weight:      Height:        PHYSICAL EXAM:  Neurologically intact ABD soft Neurovascular intact Sensation intact distally Intact pulses distally Dorsiflexion/Plantar flexion intact Incision: scant drainage and drsg changed today No cellulitis present Compartment soft  LABS  Results for orders placed or performed during the hospital encounter of 03/07/21 (from the past 24 hour(s))  Potassium     Status: None   Collection Time: 03/13/21  4:17 PM  Result Value Ref Range   Potassium 4.7 3.5 - 5.1 mmol/L    DG HIP OPERATIVE UNILAT W OR W/O PELVIS LEFT  Result Date: 03/12/2021 CLINICAL DATA:  Fracture fixation. EXAM: OPERATIVE LEFT HIP (WITH PELVIS IF PERFORMED) TECHNIQUE: Fluoroscopic spot image(s) were submitted for interpretation post-operatively. COMPARISON:  Preoperative radiograph 03/07/2021 FINDINGS: Three fluoroscopic spot views of the left hip and femur obtained in the operating room. Intramedullary nail with trans trochanteric screw fixation traversing intertrochanteric femur fracture. Fluoroscopy time 22 seconds. IMPRESSION: Procedural fluoroscopy during intertrochanteric femur fracture fixation. Electronically Signed   By: 05/07/2021 M.D.   On: 03/12/2021 18:21    Assessment/Plan: 2 Days Post-Op   Principal Problem:   left Hip fracture (HCC) Active Problems:   Anxiety and depression   Coronary artery disease involving native coronary artery of native heart without angina pectoris   Essential hypertension   Other insomnia   Pure hypercholesterolemia   Valvular heart disease   Acute lower  UTI   Advance diet Up with therapy WBAT LLE Discharge per medicine SNF Continue home Plavix Drsg changed today Continue hydrocodone for pain control, RX in chart Follow up in 2 weeks for staple removal call office to confirm appt [phone]   [phone] , PA-C 03/14/2021, 6:26 AM

## 2021-03-14 NOTE — Evaluation (Signed)
Physical Therapy Evaluation Patient Details Name: Sara Conley MRN: [id number] DOB: [birth year] Today's Date: 03/14/2021   History of Present Illness  Pt is a 87 y.o. female presenting to hospital 5/4 s/p mechanical fall (tripped while using walker).  Imaging showing comminuted intertrochanteric L hip fx.  Likely UTI.  Pt s/p L IMN femoral for L hip fx 03/12/21.  PMH includes mild dementia, htn, HLD, coronary angioplasty with stent placement.  Clinical Impression  Prior to hospital admission, pt was ambulatory with 4ww within the home; lives with her daughter in 1 level home with 2 STE with R railing; uses manual w/c in community.  H/o falling.  Pt's daughter present during session and reports being pt's POA and requesting to interpret for pt (pt's daughter reports she has been interpreting for pt during this hospitalization).  Nurse reports [place] interpreter was attempted last night but pt speaks [place] and interpreter/pt did not understand each other very well.  Therapist initially was going to use phone to call [place] interpreter to have on just in case they were needed during session but pt suddenly urgently needing to toilet (pt had received suppository) so did not have time to call for interpreter (pt's daughter interpreted during session).  Therapist attempted to call for interpreter later in session Fausto Skillern ID #[id number]) but phone connection was lost prior to being able to initiate anything.  Currently pt is mod to max assist semi-supine to sitting edge of bed; max assist for stand pivot transfers; and max assist to come to 3/4th stand up to RW (unable to come to full upright position to attempt to take steps).  Pt oriented to self only during session (pt's daughter reports pt normally A&Ox4 during the day but gets confused later in the evening at home).  Pt would benefit from skilled PT to address noted impairments and functional limitations (see below for any additional details).  Upon  hospital discharge, pt would benefit from SNF.    Follow Up Recommendations SNF    Equipment Recommendations  Rolling walker with 5" wheels;3in1 (PT);Wheelchair (measurements PT);Wheelchair cushion (measurements PT);Hospital bed    Recommendations for Other Services OT consult     Precautions / Restrictions Precautions Precautions: Fall Restrictions Weight Bearing Restrictions: Yes LLE Weight Bearing: Weight bearing as tolerated      Mobility  Bed Mobility Overal bed mobility: Needs Assistance Bed Mobility: Supine to Sit     Supine to sit: Mod assist;Max assist;HOB elevated     General bed mobility comments: assist for LE's and trunk semi-supine to sitting edge of bed    Transfers Overall transfer level: Needs assistance Equipment used: None;Rolling walker (2 wheeled) Transfers: Sit to/from [employer] Transfers Sit to Stand: Max assist;+2 safety/equipment Stand pivot transfers: Max assist;+2 safety/equipment       General transfer comment: stand pivot bed to [facility] and then BSC to recliner; pt able to come to 3/4th stand with max assist x1 up to RW  Ambulation/Gait             General Gait Details: unable to fully stand up to RW to attempt taking steps  Stairs            Wheelchair Mobility    Modified Rankin (Stroke Patients Only)       Balance Overall balance assessment: Needs assistance Sitting-balance support: Bilateral upper extremity supported;Feet supported Sitting balance-Leahy Scale: Poor Sitting balance - Comments: pt with posterior lean in sitting requiring min to mod assist for sitting balance  Pertinent Vitals/Pain Pain Assessment: Faces Faces Pain Scale: Hurts little more (4/10 at rest; 8/10 with activity) Pain Location: L hip/thigh Pain Descriptors / Indicators: Guarding;Operative site guarding;Grimacing Pain Intervention(s): Limited activity within patient's  tolerance;Monitored during session;Repositioned  Vitals (HR and O2 on room air) stable and WFL throughout treatment session.    Home Living Family/patient expects to be discharged to:: Private residence Living Arrangements: Children (pt's daughter) Available Help at Discharge: Family Type of Home: House Home Access: Stairs to enter Entrance Stairs-Rails: Right Entrance Stairs-Number of Steps: 2 Home Layout: One level Home Equipment: Grab bars - tub/shower;Toilet riser;Wheelchair - [employer] - 4 wheels;Walker - 2 wheels;Cane - single point      Prior Function Level of Independence: Needs assistance   Gait / Transfers Assistance Needed: Ambulatory with 4ww in home; uses manual w/c when in community  ADL's / Homemaking Assistance Needed: Stands when showering (has assist come in 7 days per week to assist pt with shower in morning)  Comments: H/o multiple falls     Hand Dominance        Extremity/Trunk Assessment   Upper Extremity Assessment Upper Extremity Assessment: Generalized weakness    Lower Extremity Assessment Lower Extremity Assessment: LLE deficits/detail (R LE WFL) LLE Deficits / Details: at least 3/5 AROM ankle DF/PF; hip flexion and knee flexion/extension at least 2+/5 (limited d/t hip pain) LLE: Unable to fully assess due to pain    Cervical / Trunk Assessment Cervical / Trunk Assessment: Normal  Communication   Communication: Prefers language other than [place] ([place]  Cognition Arousal/Alertness: Awake/alert Behavior During Therapy: WFL for tasks assessed/performed                                   General Comments: Oriented to person only; reports she is in [place] (normally pt is A&O x4 at home during the day but gets confused later in the evening)      General Comments General comments (skin integrity, edema, etc.): L hip dressing in place  Nursing cleared pt for participation in physical therapy.  Pt agreeable to  PT session.    Exercises  Pt's daughter agreeable to PT session.   Assessment/Plan    PT Assessment Patient needs continued PT services  PT Problem List Decreased strength;Decreased activity tolerance;Decreased balance;Decreased mobility;Decreased knowledge of use of DME;Decreased knowledge of precautions;Pain;Decreased skin integrity       PT Treatment Interventions DME instruction;Gait training;Stair training;Functional mobility training;Therapeutic activities;Therapeutic exercise;Balance training;Patient/family education    PT Goals (Current goals can be found in the Care Plan section)  Acute Rehab PT Goals Patient Stated Goal: to improve mobility PT Goal Formulation: With patient Time For Goal Achievement: 03/28/21 Potential to Achieve Goals: Fair    Frequency 7X/week   Barriers to discharge Decreased caregiver support      Co-evaluation               AM-PAC PT "6 Clicks" Mobility  Outcome Measure Help needed turning from your back to your side while in a flat bed without using bedrails?: A Little Help needed moving from lying on your back to sitting on the side of a flat bed without using bedrails?: A Lot Help needed moving to and from a bed to a chair (including a wheelchair)?: A Lot Help needed standing up from a chair using your arms (e.g., wheelchair or bedside chair)?: A Lot Help needed to walk in hospital room?: Total  Help needed climbing 3-5 steps with a railing? : Total 6 Click Score: 11    End of Session Equipment Utilized During Treatment: Gait belt Activity Tolerance: Patient limited by pain Patient left: in chair;with call bell/phone within reach;with chair alarm set;with family/visitor present;with SCD's reapplied;Other (comment) (B heels floating via pillow support) Nurse Communication: Mobility status;Precautions;Weight bearing status PT Visit Diagnosis: Other abnormalities of gait and mobility (R26.89);Muscle weakness (generalized)  (M62.81);History of falling (Z91.81);Difficulty in walking, not elsewhere classified (R26.2);Pain Pain - Right/Left: Left Pain - part of body: Hip    Time: 0919-1007 PT Time Calculation (min) (ACUTE ONLY): 48 min   Charges:   PT Evaluation $PT Eval Low Complexity: 1 Low PT Treatments $Therapeutic Activity: 8-22 mins       Hendricks Limes, PT 03/14/21, 10:58 AM

## 2021-03-14 NOTE — TOC Progression Note (Signed)
Transition of Care [facility]) - Progression Note    Patient Details  Name: Sara Conley MRN: [id number] Date of Birth: [birth year]  Transition [facility]) CM/SW [place], RN Phone Number: 03/14/2021, 11:09 AM  Clinical Narrative:    Met with the patient and her daughter in the room, they agree to SNF and want to go to [facility], I called [facility] and asked that they take a look,, PASSr obtained, FL2 completed, Bedsearch sent       Expected Discharge Plan and Services                                                 Social Determinants of Health (SDOH) Interventions    Readmission Risk Interventions No flowsheet data found.

## 2021-03-14 NOTE — TOC Progression Note (Signed)
Transition of Care [facility]) - Progression Note    Patient Details  Name: Sara Conley MRN: [id number] Date of Birth: 10/19/1918  Transition of Care [facility]) CM/SW Contact  Barrie Dunker, RN Phone Number: 03/14/2021, 3:06 PM  Clinical Narrative:     I was notified by [facility] that they are not able to accept the patient. I called the daughter and explained and reviewed the bed offers she stated that she will go see the places and let me know       Expected Discharge Plan and Services                                                 Social Determinants of Health (SDOH) Interventions    Readmission Risk Interventions No flowsheet data found.

## 2021-03-14 NOTE — Evaluation (Signed)
Occupational Therapy Evaluation Patient Details Name: Sara Conley MRN: [id number] DOB: 05-Mar-1918 Today's Date: 03/14/2021    History of Present Illness Pt is a 87 y.o. female presenting to hospital 5/4 s/p mechanical fall (tripped while using walker).  Imaging showing comminuted intertrochanteric L hip fx.  Likely UTI.  Pt s/p L IMN femoral for L hip fx 03/12/21.  PMH includes mild dementia, htn, HLD, coronary angioplasty with stent placement.   Clinical Impression   Pt seen for OT evaluation this date in setting of acute hospitalization d/t L hip fx s/p IMN. Pt presents this date with some baseline weakness expected for her age in addition to new L hip pain (fall pre-hospitalization), decreased L hip ROM, decreased tolerance for OOB activity. Pt's daughter provided most PLOF information to PT earlier this date. Pt was amb with 4WW in the home, w/c in community and had aide assistance for bathing, but was otherwise able to perform self care. On assessment this date, pt requires MAX A +2 for all transfers including to/from commode. MAX A +2 for toileting tasks including clothing mgt and peri care, MAX to TOTAL A for LB dressing, and SETUP to MIN A for seated UB ADLs. Pt left in bed with all needs met and in reach after performing toileting task with OT. RN assisted in session and aware of pt status. Will continue to follow acutely and anticipate pt will require extensive rehabilitation post-hospital stay to regain strength and reach highest level of ADL INDEP.      Follow Up Recommendations  SNF    Equipment Recommendations  3 in 1 bedside commode;Tub/shower seat;Other (comment) (2ww)    Recommendations for Other Services       Precautions / Restrictions Precautions Precautions: Fall Restrictions Weight Bearing Restrictions: Yes LLE Weight Bearing: Weight bearing as tolerated      Mobility Bed Mobility Overal bed mobility: Needs Assistance Bed Mobility: Sit to Supine       Sit  to supine: Max assist   General bed mobility comments: increased time, assist to manage trunk/LEs.    Transfers Overall transfer level: Needs assistance Equipment used: Rolling walker (2 wheeled);2 person hand held assist Transfers: Sit to/from [facility] Sit to Stand: Max assist;+2 safety/equipment Stand pivot transfers: Max assist;+2 safety/equipment       General transfer comment: increased time/assist. Attempted with RW, but ultimately requires arm in arm for safety/stability bilaterally d/t limited abiltiy to cognitively sequence RW and OT needing to be closer to patient to assist.    Balance Overall balance assessment: Needs assistance Sitting-balance support: Bilateral upper extremity supported;Feet supported Sitting balance-Leahy Scale: Poor Sitting balance - Comments: posterior or R lean noted as pt attempting to weight shift off L hip d/t pain     Standing balance-Leahy Scale: Zero Standing balance comment: bilateral support, 2p assist, MAX A                           ADL either performed or assessed with clinical judgement   ADL                                         General ADL Comments: requires MAX A For LB ADLs including dressing and posterior peri care. Pt requires SETUP to MIN A for seated UB ADLs and vidual cues to initiate.     Vision Patient  Visual Report: No change from baseline       Perception     Praxis      Pertinent Vitals/Pain Pain Assessment: Faces Faces Pain Scale: Hurts little more (4/10 rest, 8/10 with mobilizing) Pain Location: L hip/thigh Pain Descriptors / Indicators: Guarding;Operative site guarding;Grimacing Pain Intervention(s): Limited activity within patient's tolerance;Monitored during session;Repositioned     Hand Dominance     Extremity/Trunk Assessment Upper Extremity Assessment Upper Extremity Assessment: Generalized weakness   Lower Extremity Assessment Lower Extremity  Assessment: Defer to PT evaluation;Generalized weakness;LLE deficits/detail LLE: Unable to fully assess due to pain       Communication Communication Communication: Prefers language other than [place] ([place]   Cognition Arousal/Alertness: Awake/alert Behavior During Therapy: WFL for tasks assessed/performed Overall Cognitive Status: Difficult to assess                                 General Comments: Pt oriented to person. Dtr reports that pt is usually A&O x4 during the day, but gets confused during evening. Pt able to follow ~20% simple one step commands wtih visual cues to prompt her, but command following is limited d/t pt speaking [place] and unable to contact a [place] interpreter via phone resource.   General Comments       Exercises     Shoulder Instructions      Home Living Family/patient expects to be discharged to:: Private residence Living Arrangements: Children (pt's daughter) Available Help at Discharge: Family Type of Home: House Home Access: Stairs to enter Technical brewer of Steps: 2 Entrance Stairs-Rails: Right Home Layout: One level     Bathroom Shower/Tub: Occupational psychologist: Standard (with riser)     Home Equipment: Grab bars - tub/shower;Toilet riser;Wheelchair - [employer] - 4 wheels;Walker - 2 wheels;Cane - single point          Prior Functioning/Environment Level of Independence: Needs assistance  Gait / Transfers Assistance Needed: Ambulatory with 4ww in home; uses manual w/c when in community ADL's / Homemaking Assistance Needed: Stands when showering (has aide come in 7 days per week to assist pt with shower in morning)   Comments: H/o multiple falls        OT Problem List: Decreased strength;Decreased range of motion;Decreased activity tolerance;Impaired balance (sitting and/or standing);Decreased cognition;Decreased safety awareness;Decreased knowledge of use of DME or  AE;Decreased knowledge of precautions;Pain      OT Treatment/Interventions: Self-care/ADL training;DME and/or AE instruction;Therapeutic activities;Balance training;Therapeutic exercise;Patient/family education    OT Goals(Current goals can be found in the care plan section) Acute Rehab OT Goals Patient Stated Goal: none stated OT Goal Formulation: Patient unable to participate in goal setting Time For Goal Achievement: 03/28/21 Potential to Achieve Goals: Fair ADL Goals Pt Will Transfer to Toilet: with min assist;with mod assist;stand pivot transfer;bedside commode Pt Will Perform Toileting - Clothing Manipulation and hygiene: with min assist;with mod assist;sitting/lateral leans Additional ADL Goal #1: Pt will tolerate unsupported sitting x10 mins to complete ADL tasks versus theract to increase sitting tolerance.  OT Frequency: Min 2X/week   Barriers to D/C:            Co-evaluation              AM-PAC OT "6 Clicks" Daily Activity     Outcome Measure Help from another person eating meals?: A Little Help from another person taking care of personal grooming?: A Little Help from another  person toileting, which includes using toliet, bedpan, or urinal?: A Lot Help from another person bathing (including washing, rinsing, drying)?: A Lot Help from another person to put on and taking off regular upper body clothing?: A Little Help from another person to put on and taking off regular lower body clothing?: Total 6 Click Score: 14   End of Session Equipment Utilized During Treatment: Gait belt Nurse Communication: Mobility status  Activity Tolerance: Patient tolerated treatment well Patient left: in bed;with call bell/phone within reach;with bed alarm set;with nursing/sitter in room  OT Visit Diagnosis: Unsteadiness on feet (R26.81);Muscle weakness (generalized) (M62.81);Pain Pain - Right/Left: Left Pain - part of body: Hip                Time: 7793-9688 OT Time Calculation  (min): 22 min Charges:  OT General Charges $OT Visit: 1 Visit OT Evaluation $OT Eval Moderate Complexity: 1 Mod OT Treatments $Self Care/Home Management : 8-22 mins  Gerrianne Scale, MS, OTR/L ascom [phone] 03/14/21, 5:46 PM

## 2021-03-15 DIAGNOSIS — N390 Urinary tract infection, site not specified: Secondary | ICD-10-CM | POA: Diagnosis not present

## 2021-03-15 DIAGNOSIS — I2510 Atherosclerotic heart disease of native coronary artery without angina pectoris: Secondary | ICD-10-CM | POA: Diagnosis not present

## 2021-03-15 DIAGNOSIS — S72002A Fracture of unspecified part of neck of left femur, initial encounter for closed fracture: Secondary | ICD-10-CM | POA: Diagnosis not present

## 2021-03-15 DIAGNOSIS — F419 Anxiety disorder, unspecified: Secondary | ICD-10-CM | POA: Diagnosis not present

## 2021-03-15 LAB — CBC
HCT: 23.6 % — ABNORMAL LOW (ref 36.0–46.0)
Hemoglobin: 7.7 g/dL — ABNORMAL LOW (ref 12.0–15.0)
MCH: 31.8 pg (ref 26.0–34.0)
MCHC: 32.6 g/dL (ref 30.0–36.0)
MCV: 97.5 fL (ref 80.0–100.0)
Platelets: 158 10*3/uL (ref 150–400)
RBC: 2.42 MIL/uL — ABNORMAL LOW (ref 3.87–5.11)
RDW: 14.3 % (ref 11.5–15.5)
WBC: 8 10*3/uL (ref 4.0–10.5)
nRBC: 0 % (ref 0.0–0.2)

## 2021-03-15 LAB — BASIC METABOLIC PANEL
Anion gap: 8 (ref 5–15)
BUN: 63 mg/dL — ABNORMAL HIGH (ref 8–23)
CO2: 25 mmol/L (ref 22–32)
Calcium: 8.1 mg/dL — ABNORMAL LOW (ref 8.9–10.3)
Chloride: 102 mmol/L (ref 98–111)
Creatinine, Ser: 1.48 mg/dL — ABNORMAL HIGH (ref 0.44–1.00)
GFR, Estimated: 31 mL/min — ABNORMAL LOW (ref >60)
Glucose, Bld: 100 mg/dL — ABNORMAL HIGH (ref 70–99)
Potassium: 4.2 mmol/L (ref 3.5–5.1)
Sodium: 135 mmol/L (ref 135–145)

## 2021-03-15 NOTE — Progress Notes (Signed)
PROGRESS NOTE    Sara Conley  BZM:[id number] DOB: April 06, 1918 DOA: 03/07/2021 PCP: Marisue Ivan, MD    HPI was taken from Dr. Mikeal Hawthorne:  Sara Conley is a 87 y.o. female with medical history significant of hypertension, hyperlipidemia, dementia, who only speaks [place] brought in by her daughter after sustaining a mechanical fall at home and having pain on her left side.  She tripped low while using her walker in the restroom.  She fell on her left side.  There was severe pain in the area and she called out to her daughter.  Patient was seen in the ER and has left intertrochanteric fracture.  Orthopedics consulted but patient is on Plavix.  Recommendation is for admission and getting patient ready for surgery probably in about 3 days.  She did not hit her head.  Patient is frail but has been apparently doing well in her usual state of health.  Able to do her ADLs according to the daughter.  She was using the walker but can move around the house.  She otherwise has no other complaints.  No other areas of injury..  ED Course: Temperature is 98.6 initial blood pressure 207/66 with pulse 59 respirate of 16 oxygen sat 87% on room air.  Currently 100%.  White count is 11.1 hemoglobin 10.7 platelets of 189.  Chemistry mostly within normal except BUN 35 calcium 8.7.  Glucose of 115.  Viral screen including COVID-19 is negative urinalysis showed WBC 16 with rare bacteria and positive nitrite.  Head CT without contrast showed no acute findings.  Chest x-ray showed no significant findings.  X-ray of the left hip showed comminuted intertrochanteric left hip fracture.  Patient being admitted to the hospital for further evaluation and treatment.   Hospital course from Dr. Mayford Knife 5/5-5/10/22: Pt presented w/ left hip fracture secondary to fall at home. Pt is s/p left intramedullary nail on 03/12/21 as per ortho surg. PT/OT should see the pt today. Pt's daughter is hopeful that the pt will go to SNF after  d/c. Also, pt has UTI and is on IV rocephin   Assessment & Plan:   Principal Problem:   left Hip fracture (HCC) Active Problems:   Anxiety and depression   Coronary artery disease involving native coronary artery of native heart without angina pectoris   Essential hypertension   Other insomnia   Pure hypercholesterolemia   Valvular heart disease   Acute lower UTI   Left intertrochanteric fracture: s/p mechanical fall. Restarted plavix. S/p intramedullary nail on 03/12/21.  We will stop narcotics for pain as she is too sleepy from that  HTN: Blood pressure soft will hold metoprolol  Hyperkalemia: likely secondary to ARB use. Lokelma x 1.  Normalized  HLD: continue on statin   E. Coli UTI: completed course of Abx. Urine cx is growing e. coli   Hx of CAD: continue on home dose of metoprolol,statin. Continue to hold ARB secondary to hyperkalemia. Restarted plavix    Dementia: continue w/ supportive care. Continue on seroquel qhs   Thrombocytopenia: resolved   Macrocytic anemia: folate, B12 are WNL. H&H are stable   Leukocytosis: labile. Will continue to monitor    DVT prophylaxis: lovenox  Code Status: full  Family Communication: discussed pt's care w/ pt's daughter at bedside and answered her questions on 5/12 Disposition Plan: SNF  Level of care: Med-Surg   Status is: Inpatient  Remains inpatient appropriate because:Ongoing diagnostic testing needed not appropriate for outpatient work up, Unsafe d/c plan, IV treatments  appropriate due to intensity of illness or inability to take PO and Inpatient level of care appropriate due to severity of illness   Dispo: The patient is from: Home              Anticipated d/c is to: SNF              Patient currently is not medically stable to d/c.   Difficult to place patient: unclear     Consultants:   Ortho surg  Procedures:  Hip surgery  Antimicrobials:   Subjective: Patient had a large bowel movement yesterday.  She  is very sleepy today and would not wake up for me.  Objective: Vitals:   03/14/21 1510 03/14/21 1959 03/15/21 0509 03/15/21 0746  BP: (!) 153/59 (!) 122/98 (!) 102/49 (!) 102/45  Pulse: 90 97 83 81  Resp: 16  17 15   Temp: 97.9 F (36.6 C)  97.6 F (36.4 C) 97.6 F (36.4 C)  TempSrc: Oral   Oral  SpO2: 94% 95% 93% 94%  Weight:      Height:        Intake/Output Summary (Last 24 hours) at 03/15/2021 1124 Last data filed at 03/15/2021 1009 Gross per 24 hour  Intake 240 ml  Output --  Net 240 ml   Filed Weights   03/07/21 1717  Weight: 42.6 kg    Examination:  General exam: Frail appearing. Appears comfortable  Respiratory system: clear breath sounds b/l. No rales  Cardiovascular system: S1/S2+. No gallops or rubs  Gastrointestinal system: Abd is soft, NT, ND & normal bowel sounds  Central nervous system:  nonfocal exam. sleepy Psychiatry: patient sleepy    Data Reviewed: I have personally reviewed following labs and imaging studies  CBC: Recent Labs  Lab 03/11/21 0509 03/12/21 0453 03/12/21 2229 03/13/21 0620 03/14/21 0539 03/15/21 0543  WBC 12.0* 11.8*  --  11.2* 10.2 8.0  HGB 8.2* 8.5* 8.7* 8.5* 7.2* 7.7*  HCT 25.1* 26.2* 27.4* 26.8* 21.7* 23.6*  MCV 97.7 99.2  --  99.3 96.9 97.5  PLT 129* 147*  --  158 153 158   Basic Metabolic Panel: Recent Labs  Lab 03/11/21 0509 03/12/21 0453 03/13/21 0620 03/13/21 1617 03/14/21 0539 03/15/21 0543  NA 133* 135 136  --  135 135  K 4.0 4.2 5.4* 4.7 4.5 4.2  CL 101 101 99  --  101 102  CO2 27 25 24   --  26 25  GLUCOSE 107* 94 128*  --  107* 100*  BUN 18 25* 49*  --  67* 63*  CREATININE 0.59 0.80 1.51*  --  1.75* 1.48*  CALCIUM 8.2* 8.1* 8.4*  --  8.0* 8.1*   GFR: Estimated Creatinine Clearance: 13 mL/min (A) (by C-G formula based on SCr of 1.48 mg/dL (H)). Liver Function Tests: No results for input(s): AST, ALT, ALKPHOS, BILITOT, PROT, ALBUMIN in the last 168 hours. No results for input(s): LIPASE, AMYLASE  in the last 168 hours. No results for input(s): AMMONIA in the last 168 hours. Coagulation Profile: No results for input(s): INR, PROTIME in the last 168 hours. Cardiac Enzymes: No results for input(s): CKTOTAL, CKMB, CKMBINDEX, TROPONINI in the last 168 hours. BNP (last 3 results) No results for input(s): PROBNP in the last 8760 hours. HbA1C: No results for input(s): HGBA1C in the last 72 hours. CBG: No results for input(s): GLUCAP in the last 168 hours. Lipid Profile: No results for input(s): CHOL, HDL, LDLCALC, TRIG, CHOLHDL, LDLDIRECT in the last  72 hours. Thyroid Function Tests: No results for input(s): TSH, T4TOTAL, FREET4, T3FREE, THYROIDAB in the last 72 hours. Anemia Panel: No results for input(s): VITAMINB12, FOLATE, FERRITIN, TIBC, IRON, RETICCTPCT in the last 72 hours. Sepsis Labs: No results for input(s): PROCALCITON, LATICACIDVEN in the last 168 hours.  Recent Results (from the past 240 hour(s))  Resp Panel by RT-PCR (Flu A&B, Covid) Nasopharyngeal Swab     Status: None   Collection Time: 03/07/21  6:59 PM   Specimen: Nasopharyngeal Swab; Nasopharyngeal(NP) swabs in vial transport medium  Result Value Ref Range Status   SARS Coronavirus 2 by RT PCR NEGATIVE NEGATIVE Final    Comment: (NOTE) SARS-CoV-2 target nucleic acids are NOT DETECTED.  The SARS-CoV-2 RNA is generally detectable in upper respiratory specimens during the acute phase of infection. The lowest concentration of SARS-CoV-2 viral copies this assay can detect is 138 copies/mL. A negative result does not preclude SARS-Cov-2 infection and should not be used as the sole basis for treatment or other patient management decisions. A negative result may occur with  improper specimen collection/handling, submission of specimen other than nasopharyngeal swab, presence of viral mutation(s) within the areas targeted by this assay, and inadequate number of viral copies(<138 copies/mL). A negative result must be  combined with clinical observations, patient history, and epidemiological information. The expected result is Negative.  Fact Sheet for Patients:  BloggerCourse.com  Fact Sheet for Healthcare Providers:  SeriousBroker.it  This test is no t yet approved or cleared by the [place] FDA and  has been authorized for detection and/or diagnosis of SARS-CoV-2 by FDA under an Emergency Use Authorization (EUA). This EUA will remain  in effect (meaning this test can be used) for the duration of the COVID-19 declaration under Section 564(b)(1) of the Act, 21 U.S.C.section 360bbb-3(b)(1), unless the authorization is terminated  or revoked sooner.       Influenza A by PCR NEGATIVE NEGATIVE Final   Influenza B by PCR NEGATIVE NEGATIVE Final    Comment: (NOTE) The Xpert Xpress SARS-CoV-2/FLU/RSV plus assay is intended as an aid in the diagnosis of influenza from Nasopharyngeal swab specimens and should not be used as a sole basis for treatment. Nasal washings and aspirates are unacceptable for Xpert Xpress SARS-CoV-2/FLU/RSV testing.  Fact Sheet for Patients: BloggerCourse.com  Fact Sheet for Healthcare Providers: SeriousBroker.it  This test is not yet approved or cleared by the [place] FDA and has been authorized for detection and/or diagnosis of SARS-CoV-2 by FDA under an Emergency Use Authorization (EUA). This EUA will remain in effect (meaning this test can be used) for the duration of the COVID-19 declaration under Section 564(b)(1) of the Act, 21 U.S.C. section 360bbb-3(b)(1), unless the authorization is terminated or revoked.  Performed at [facility], [address] [place], [place] [address]   Urine culture     Status: Abnormal   Collection Time: 03/07/21  6:59 PM   Specimen: Urine, Random  Result Value Ref Range Status   Specimen Description   Final     URINE, RANDOM Performed at [facility], [address], [place], [place] [address]    Special Requests   Final    NONE Performed at [facility], [address] [place], [place] [address]    Culture >=100,000 COLONIES/mL ESCHERICHIA COLI (A)  Final   Report Status 03/10/2021 FINAL  Final   Organism ID, Bacteria ESCHERICHIA COLI (A)  Final      Susceptibility   Escherichia coli - MIC*  AMPICILLIN <=2 SENSITIVE Sensitive     CEFAZOLIN <=4 SENSITIVE Sensitive     CEFEPIME <=0.12 SENSITIVE Sensitive     CEFTRIAXONE <=0.25 SENSITIVE Sensitive     CIPROFLOXACIN <=0.25 SENSITIVE Sensitive     GENTAMICIN <=1 SENSITIVE Sensitive     IMIPENEM <=0.25 SENSITIVE Sensitive     NITROFURANTOIN <=16 SENSITIVE Sensitive     TRIMETH/SULFA <=20 SENSITIVE Sensitive     AMPICILLIN/SULBACTAM <=2 SENSITIVE Sensitive     PIP/TAZO <=4 SENSITIVE Sensitive     * >=100,000 COLONIES/mL ESCHERICHIA COLI         Radiology Studies: No results found.      Scheduled Meds: . atorvastatin  40 mg Oral QHS  . bisacodyl  10 mg Rectal Daily  . Chlorhexidine Gluconate Cloth  6 each Topical Daily  . clopidogrel  75 mg Oral Daily  . docusate sodium  100 mg Oral BID  . doxepin  10 mg Oral QHS  . polyethylene glycol  17 g Oral Daily  . QUEtiapine  25 mg Oral QHS   Continuous Infusions:    LOS: 8 days    Time spent: 30 mins    Delfino LovettVipul Kahlia Lagunes, MD [facility] Pager [phone]  If 7PM-7AM, please contact night-coverage 03/15/2021, 11:24 AM

## 2021-03-15 NOTE — Care Management Important Message (Signed)
Important Message  Patient Details  Name: Sara Conley MRN: [id number] Date of Birth: [birth year]   Medicare Important Message Given:  Yes  Patient speaks [place] and our RN CM has been discussing discharge plans with her daughter, Sara Conley so I called her [phone]).  Her husband, Sara Conley answered and said she was finally resting. I explained I was trying to reach her to review a form with her and he provided me with her e-mail address:  [email or web address].  I sent the form securely to this e-mail so she could review at her convenience and asked if she could send me a signed copy to my e-mail address and provided my phone number if she had any questions. I thanked him for his time.  Olegario Messier A Neve Branscomb 03/15/2021, 2:32 PM

## 2021-03-15 NOTE — Progress Notes (Signed)
  Subjective:  Patient reports pain as mild.    Objective:   VITALS:   Vitals:   03/14/21 1111 03/14/21 1510 03/14/21 1959 03/15/21 0509  BP: (!) 131/49 (!) 153/59 (!) 122/98 (!) 102/49  Pulse: 72 90 97 83  Resp: 16 16  17   Temp: 97.7 F (36.5 C) 97.9 F (36.6 C)  97.6 F (36.4 C)  TempSrc: Oral Oral    SpO2: 100% 94% 95% 93%  Weight:      Height:        PHYSICAL EXAM:  Neurologically intact ABD soft Neurovascular intact Sensation intact distally Intact pulses distally Dorsiflexion/Plantar flexion intact Incision: dressing C/D/I and scant drainage No cellulitis present Compartment soft  LABS  Results for orders placed or performed during the hospital encounter of 03/07/21 (from the past 24 hour(s))  CBC     Status: Abnormal   Collection Time: 03/15/21  5:43 AM  Result Value Ref Range   WBC 8.0 4.0 - 10.5 K/uL   RBC 2.42 (L) 3.87 - 5.11 MIL/uL   Hemoglobin 7.7 (L) 12.0 - 15.0 g/dL   HCT 05/15/21 (L) 88.9 - 16.9 %   MCV 97.5 80.0 - 100.0 fL   MCH 31.8 26.0 - 34.0 pg   MCHC 32.6 30.0 - 36.0 g/dL   RDW 45.0 38.8 - 82.8 %   Platelets 158 150 - 400 K/uL   nRBC 0.0 0.0 - 0.2 %  Basic metabolic panel     Status: Abnormal   Collection Time: 03/15/21  5:43 AM  Result Value Ref Range   Sodium 135 135 - 145 mmol/L   Potassium 4.2 3.5 - 5.1 mmol/L   Chloride 102 98 - 111 mmol/L   CO2 25 22 - 32 mmol/L   Glucose, Bld 100 (H) 70 - 99 mg/dL   BUN 63 (H) 8 - 23 mg/dL   Creatinine, Ser 05/15/21 (H) 0.44 - 1.00 mg/dL   Calcium 8.1 (L) 8.9 - 10.3 mg/dL   GFR, Estimated 31 (L) >60 mL/min   Anion gap 8 5 - 15    No results found.  Assessment/Plan: 3 Days Post-Op   Principal Problem:   left Hip fracture (HCC) Active Problems:   Anxiety and depression   Coronary artery disease involving native coronary artery of native heart without angina pectoris   Essential hypertension   Other insomnia   Pure hypercholesterolemia   Valvular heart disease   Acute lower  UTI   Advance diet Up with therapy WBATLLE Discharge per medicine SNF Continue homePlavix Drsg changed today Continue hydrocodone for pain control, RX in chart Follow up in 2 weeks for staple removal call office to confirm appt [phone]   [phone] , PA-C 03/15/2021, 6:43 AM

## 2021-03-15 NOTE — Progress Notes (Signed)
Occupational Therapy Treatment Patient Details Name: Sara Conley MRN: [id number] DOB: 10-May-1918 Today's Date: 03/15/2021    History of present illness Pt is a 87 y.o. female presenting to hospital 5/4 s/p mechanical fall (tripped while using walker).  Imaging showing comminuted intertrochanteric L hip fx.  Likely UTI.  Pt s/p L IMN femoral for L hip fx 03/12/21.  PMH includes mild dementia, htn, HLD, coronary angioplasty with stent placement.   OT comments  Pt seen for OT treatment this date to f/u re: safety with ADLs/ADL mobility. Pt with some increased confusion and appeared somewhat anxious, but intermittently drowsy which impacted her functional performance this date. Pt requires MOD/MAX A to come to EOB sitting and demos P static sitting balance requireing MIN A to sustain. Pt requires MAX A with arm in arm technique to SPS from EOB to recliner and requires calming/relaxation strategies throughout as well as MOD tactile cues to sequence. Pt requires MOD A to perform self feeding and drinking while supported sitting in recliner. Increased time required in all aspects of eating/drinking and pt's daughter presents to assist with interpretation. Pt left in chair with chair alarm. All needs met and in reach. Pt's RN aware of session contents. Will continue to follow. Continue to anticipate pt will require extensive rehabilitation to resume a safe level of ADL and fxl mobility performance with AD/DME as needed.    Follow Up Recommendations  SNF    Equipment Recommendations  3 in 1 bedside commode;Tub/shower seat;Other (comment) (2ww)    Recommendations for Other Services      Precautions / Restrictions Precautions Precautions: Fall Restrictions Weight Bearing Restrictions: Yes LLE Weight Bearing: Weight bearing as tolerated       Mobility Bed Mobility Overal bed mobility: Needs Assistance Bed Mobility: Supine to Sit     Supine to sit: Mod assist;Max assist;HOB elevated      General bed mobility comments: increased time, assist to manage trunk/LEs, cues to sequence, calming/relaxation techniques.    Transfers Overall transfer level: Needs assistance Equipment used: 1 person hand held assist     Stand pivot transfers: Max assist       General transfer comment: arm in arm technique to SPS from EOB To chair. Calming/relaxation tehcniques for pain and general anxiousness/agitation.    Balance Overall balance assessment: Needs assistance Sitting-balance support: Feet supported;Bilateral upper extremity supported Sitting balance-Leahy Scale: Poor Sitting balance - Comments: requires MIN A at least to sustain static sitting     Standing balance-Leahy Scale: Zero Standing balance comment: MAX A, b/l UE support                           ADL either performed or assessed with clinical judgement   ADL Overall ADL's : Needs assistance/impaired Eating/Feeding: Moderate assistance;Sitting Eating/Feeding Details (indicate cue type and reason): cues to sequence and MOD A to manage cup ~1/2 full with lid and straw. Supported sitting in chair Grooming: Dance movement psychotherapist;Moderate assistance;Sitting Grooming Details (indicate cue type and reason): supported sitting in recliner                                     Vision       Perception     Praxis      Cognition Arousal/Alertness: Awake/alert Behavior During Therapy: WFL for tasks assessed/performed;Agitated Overall Cognitive Status: Impaired/Different from baseline Area of Impairment: Orientation;Attention;Memory;Following commands;Safety/judgement;Problem solving  Orientation Level: Disoriented to;Place;Time;Situation Current Attention Level: Alternating Memory: Decreased short-term memory Following Commands: Follows one step commands inconsistently;Follows one step commands with increased time Safety/Judgement: Decreased awareness of safety;Decreased  awareness of deficits   Problem Solving: Slow processing;Decreased initiation;Difficulty sequencing;Requires verbal cues;Requires tactile cues General Comments: dtr reports pt typically oriented during the day at home and some confusion at night time, "sun-downs". This date, difficult to get pt to attend to or sequence even basic seated UB tasks without physical assist and cues. Pt somewhat more confused and agitated than yesterday.        Exercises     Shoulder Instructions       General Comments      Pertinent Vitals/ Pain       Pain Assessment: Faces Faces Pain Scale: Hurts whole lot Pain Location: L hip/thigh w/ mobilization Pain Descriptors / Indicators: Guarding;Operative site guarding;Grimacing Pain Intervention(s): Limited activity within patient's tolerance;Monitored during session;Repositioned (RN reports the dtr was not wanting pain medication given as it makes pt drowsy.)  Home Living                                          Prior Functioning/Environment              Frequency  Min 2X/week        Progress Toward Goals  OT Goals(current goals can now be found in the care plan section)  Progress towards OT goals: Not progressing toward goals - comment (pt with increased confusion/agitation during session today, lmiiteing progress that could be made with OT goals and in addition, some lethargy/drowsiness was limiting pt's ability to perform at the same level she was on E yesterday for UB ADLs.)  Acute Rehab OT Goals Patient Stated Goal: to get her stronger and better able to move OT Goal Formulation: With family Time For Goal Achievement: 03/28/21 Potential to Achieve Goals: [place] Discharge plan remains appropriate    Co-evaluation                 AM-PAC OT "6 Clicks" Daily Activity     Outcome Measure   Help from another person eating meals?: A Lot Help from another person taking care of personal grooming?: A Lot Help  from another person toileting, which includes using toliet, bedpan, or urinal?: A Lot Help from another person bathing (including washing, rinsing, drying)?: A Lot Help from another person to put on and taking off regular upper body clothing?: A Lot Help from another person to put on and taking off regular lower body clothing?: Total 6 Click Score: 11    End of Session Equipment Utilized During Treatment: Gait belt  OT Visit Diagnosis: Unsteadiness on feet (R26.81);Muscle weakness (generalized) (M62.81);Pain Pain - Right/Left: Left Pain - part of body: Hip   Activity Tolerance Patient tolerated treatment well;Treatment limited secondary to agitation   Patient Left with call bell/phone within reach;in chair;with chair alarm set;with family/visitor present   Nurse Communication Mobility status        Time: 8299-3716 OT Time Calculation (min): 38 min  Charges: OT General Charges $OT Visit: 1 Visit OT Treatments $Self Care/Home Management : 23-37 mins $Therapeutic Activity: 8-22 mins  Gerrianne Scale, MS, OTR/L ascom [phone] 03/15/21, 4:35 PM

## 2021-03-15 NOTE — Progress Notes (Signed)
PT Cancellation Note  Patient Details Name: Sara Conley MRN: [id number] DOB: 09/17/1918   Cancelled Treatment:    Reason Eval/Treat Not Completed: Fatigue/lethargy limiting ability to participate.  Chart reviewed.  Staff reporting pt has been very sleepy today; per MD note this morning pt "would not wake up for me".  Pt appearing to be sleeping upon PT entering pt's room.  Pt did not wake to vc's and tactile cues; nurse notified.  Will re-attempt PT treatment session at a later date/time.  Hendricks Limes, PT 03/15/21, 1:49 PM

## 2021-03-15 NOTE — Plan of Care (Signed)

## 2021-03-15 NOTE — TOC Progression Note (Signed)
Transition of Care [facility]) - Progression Note    Patient Details  Name: Sara Conley MRN: [id number] Date of Birth: [birth year]  [facility] CM/SW Contact  Barrie Dunker, RN Phone Number: 03/15/2021, 3:17 PM  Clinical Narrative:   Sara Conley and requested an update, it was sent to Medical director for review, they should have an answer tomorrow         Expected Discharge Plan and Services                                                 Social Determinants of Health (SDOH) Interventions    Readmission Risk Interventions No flowsheet data found.

## 2021-03-16 DIAGNOSIS — S72002A Fracture of unspecified part of neck of left femur, initial encounter for closed fracture: Secondary | ICD-10-CM | POA: Diagnosis not present

## 2021-03-16 DIAGNOSIS — I2510 Atherosclerotic heart disease of native coronary artery without angina pectoris: Secondary | ICD-10-CM | POA: Diagnosis not present

## 2021-03-16 DIAGNOSIS — L8990 Pressure ulcer of unspecified site, unspecified stage: Secondary | ICD-10-CM | POA: Insufficient documentation

## 2021-03-16 DIAGNOSIS — N390 Urinary tract infection, site not specified: Secondary | ICD-10-CM | POA: Diagnosis not present

## 2021-03-16 DIAGNOSIS — F419 Anxiety disorder, unspecified: Secondary | ICD-10-CM | POA: Diagnosis not present

## 2021-03-16 MED ORDER — KETOROLAC TROMETHAMINE 15 MG/ML IJ SOLN
15.0000 mg | Freq: Once | INTRAMUSCULAR | Status: AC
  Administered 2021-03-17: 15 mg via INTRAVENOUS
  Filled 2021-03-16: qty 1

## 2021-03-16 NOTE — Progress Notes (Signed)
Pt underpad changed, 1 urination, pt resting  comfortable in bed.

## 2021-03-16 NOTE — Progress Notes (Signed)
PROGRESS NOTE    [name]  HOZ:[id number] DOB: 04-08-1918 DOA: 03/07/2021 PCP: Marisue Ivan, MD    HPI was taken from Dr. Mikeal Hawthorne:  Sara Conley is a 87 y.o. female with medical history significant of hypertension, hyperlipidemia, dementia, who only speaks [place] brought in by her daughter after sustaining a mechanical fall at home and having pain on her left side.  She tripped low while using her walker in the restroom.  She fell on her left side.  There was severe pain in the area and she called out to her daughter.  Patient was seen in the ER and has left intertrochanteric fracture.  Orthopedics consulted but patient is on Plavix.  Recommendation is for admission and getting patient ready for surgery probably in about 3 days.  She did not hit her head.  Patient is frail but has been apparently doing well in her usual state of health.  Able to do her ADLs according to the daughter.  She was using the walker but can move around the house.  She otherwise has no other complaints.  No other areas of injury..  ED Course: Temperature is 98.6 initial blood pressure 207/66 with pulse 59 respirate of 16 oxygen sat 87% on room air.  Currently 100%.  White count is 11.1 hemoglobin 10.7 platelets of 189.  Chemistry mostly within normal except BUN 35 calcium 8.7.  Glucose of 115.  Viral screen including COVID-19 is negative urinalysis showed WBC 16 with rare bacteria and positive nitrite.  Head CT without contrast showed no acute findings.  Chest x-ray showed no significant findings.  X-ray of the left hip showed comminuted intertrochanteric left hip fracture.  Patient being admitted to the hospital for further evaluation and treatment.   Hospital course from Dr. Mayford Knife 5/5-5/10/22: Pt presented w/ left hip fracture secondary to fall at home. Pt is s/p left intramedullary nail on 03/12/21 as per ortho surg. PT/OT should see the pt today. Pt's daughter is hopeful that the pt will go to SNF after  d/c. Also, pt has UTI and is on IV rocephin    5/11-PT, OT recommended SNF.  Waiting for insurance Auth 5/12 -insurance declined.  Patient was very sleepy likely from narcotic and benzos. 5/13 -waiting on insurance appeal.  PT and OT reeval still recommending SNF.  Patient awake and alert today  Assessment & Plan:   Principal Problem:   left Hip fracture (HCC) Active Problems:   Anxiety and depression   Coronary artery disease involving native coronary artery of native heart without angina pectoris   Essential hypertension   Other insomnia   Pure hypercholesterolemia   Valvular heart disease   Acute lower UTI   Left intertrochanteric fracture: s/p mechanical fall.  Continue plavix. S/p intramedullary nail on 03/12/21.  Pain medicine as needed preferably nonnarcotic patient was lucid for long time yesterday considering receipt of narcotic and benzo's night before.  She is awake and alert today sitting in the chair and watching on her iPad  HTN: Blood pressure soft will hold metoprolol  Hyperkalemia: likely secondary to ARB use. Lokelma x 1.  Normalized  HLD: continue on statin   E. Coli UTI: completed course of Abx. Urine cx is growing e. coli   Hx of CAD: continue on home dose of metoprolol,statin. Continue to hold ARB secondary to hyperkalemia. Restarted plavix    Dementia: continue w/ supportive care. Continue on seroquel qhs   Thrombocytopenia: resolved   Macrocytic anemia: folate, B12 are WNL. H&H are  stable   Leukocytosis: labile. Will continue to monitor    DVT prophylaxis: lovenox  Code Status: full  Family Communication: discussed pt's care w/ pt's daughter at bedside and answered her questions on 5/12 Disposition Plan: SNF  Level of care: Med-Surg   Status is: Inpatient  Remains inpatient appropriate because:Ongoing diagnostic testing needed not appropriate for outpatient work up, Unsafe d/c plan, IV treatments appropriate due to intensity of illness or  inability to take PO and Inpatient level of care appropriate due to severity of illness   Dispo: The patient is from: Home              Anticipated d/c is to: SNF              Patient currently is not medically stable to d/c.   Difficult to place patient: unclear     Consultants:   Ortho surg  Procedures:  Hip surgery  Antimicrobials:   Subjective: Patient awake and alert, sitting in her chair watching iPad.  Daughter at bedside.  Family did not seem happy hearing insurance for SNF  Objective: Vitals:   03/15/21 0746 03/15/21 1142 03/16/21 0517 03/16/21 0836  BP: (!) 102/45 (!) 107/50 (!) 144/61 132/84  Pulse: 81 91 78 89  Resp: 15 18  16   Temp: 97.6 F (36.4 C) 97.7 F (36.5 C) (!) 97.5 F (36.4 C) 98.6 F (37 C)  TempSrc: Oral Oral Oral   SpO2: 94% 93%  98%  Weight:      Height:        Intake/Output Summary (Last 24 hours) at 03/16/2021 1532 Last data filed at 03/16/2021 1403 Gross per 24 hour  Intake 240 ml  Output 150 ml  Net 90 ml   Filed Weights   03/07/21 1717  Weight: 42.6 kg    Examination:  General exam: Frail appearing. Appears comfortable  Respiratory system: clear breath sounds b/l. No rales  Cardiovascular system: S1/S2+. No gallops or rubs  Gastrointestinal system: Abd is soft, NT, ND & normal bowel sounds  Central nervous system:  nonfocal exam.  Awake and alert Psychiatry: Normal mood and affect    Data Reviewed: I have personally reviewed following labs and imaging studies  CBC: Recent Labs  Lab 03/11/21 0509 03/12/21 0453 03/12/21 2229 03/13/21 0620 03/14/21 0539 03/15/21 0543  WBC 12.0* 11.8*  --  11.2* 10.2 8.0  HGB 8.2* 8.5* 8.7* 8.5* 7.2* 7.7*  HCT 25.1* 26.2* 27.4* 26.8* 21.7* 23.6*  MCV 97.7 99.2  --  99.3 96.9 97.5  PLT 129* 147*  --  158 153 158   Basic Metabolic Panel: Recent Labs  Lab 03/11/21 0509 03/12/21 0453 03/13/21 0620 03/13/21 1617 03/14/21 0539 03/15/21 0543  NA 133* 135 136  --  135 135  K  4.0 4.2 5.4* 4.7 4.5 4.2  CL 101 101 99  --  101 102  CO2 27 25 24   --  26 25  GLUCOSE 107* 94 128*  --  107* 100*  BUN 18 25* 49*  --  67* 63*  CREATININE 0.59 0.80 1.51*  --  1.75* 1.48*  CALCIUM 8.2* 8.1* 8.4*  --  8.0* 8.1*   GFR: Estimated Creatinine Clearance: 13 mL/min (A) (by C-G formula based on SCr of 1.48 mg/dL (H)). Liver Function Tests: No results for input(s): AST, ALT, ALKPHOS, BILITOT, PROT, ALBUMIN in the last 168 hours. No results for input(s): LIPASE, AMYLASE in the last 168 hours. No results for input(s): AMMONIA in the last  168 hours. Coagulation Profile: No results for input(s): INR, PROTIME in the last 168 hours. Cardiac Enzymes: No results for input(s): CKTOTAL, CKMB, CKMBINDEX, TROPONINI in the last 168 hours. BNP (last 3 results) No results for input(s): PROBNP in the last 8760 hours. HbA1C: No results for input(s): HGBA1C in the last 72 hours. CBG: No results for input(s): GLUCAP in the last 168 hours. Lipid Profile: No results for input(s): CHOL, HDL, LDLCALC, TRIG, CHOLHDL, LDLDIRECT in the last 72 hours. Thyroid Function Tests: No results for input(s): TSH, T4TOTAL, FREET4, T3FREE, THYROIDAB in the last 72 hours. Anemia Panel: No results for input(s): VITAMINB12, FOLATE, FERRITIN, TIBC, IRON, RETICCTPCT in the last 72 hours. Sepsis Labs: No results for input(s): PROCALCITON, LATICACIDVEN in the last 168 hours.  Recent Results (from the past 240 hour(s))  Resp Panel by RT-PCR (Flu A&B, Covid) Nasopharyngeal Swab     Status: None   Collection Time: 03/07/21  6:59 PM   Specimen: Nasopharyngeal Swab; Nasopharyngeal(NP) swabs in vial transport medium  Result Value Ref Range Status   SARS Coronavirus 2 by RT PCR NEGATIVE NEGATIVE Final    Comment: (NOTE) SARS-CoV-2 target nucleic acids are NOT DETECTED.  The SARS-CoV-2 RNA is generally detectable in upper respiratory specimens during the acute phase of infection. The lowest concentration of  SARS-CoV-2 viral copies this assay can detect is 138 copies/mL. A negative result does not preclude SARS-Cov-2 infection and should not be used as the sole basis for treatment or other patient management decisions. A negative result may occur with  improper specimen collection/handling, submission of specimen other than nasopharyngeal swab, presence of viral mutation(s) within the areas targeted by this assay, and inadequate number of viral copies(<138 copies/mL). A negative result must be combined with clinical observations, patient history, and epidemiological information. The expected result is Negative.  Fact Sheet for Patients:  BloggerCourse.com  Fact Sheet for Healthcare Providers:  SeriousBroker.it  This test is no t yet approved or cleared by the [place] FDA and  has been authorized for detection and/or diagnosis of SARS-CoV-2 by FDA under an Emergency Use Authorization (EUA). This EUA will remain  in effect (meaning this test can be used) for the duration of the COVID-19 declaration under Section 564(b)(1) of the Act, 21 U.S.C.section 360bbb-3(b)(1), unless the authorization is terminated  or revoked sooner.       Influenza A by PCR NEGATIVE NEGATIVE Final   Influenza B by PCR NEGATIVE NEGATIVE Final    Comment: (NOTE) The Xpert Xpress SARS-CoV-2/FLU/RSV plus assay is intended as an aid in the diagnosis of influenza from Nasopharyngeal swab specimens and should not be used as a sole basis for treatment. Nasal washings and aspirates are unacceptable for Xpert Xpress SARS-CoV-2/FLU/RSV testing.  Fact Sheet for Patients: BloggerCourse.com  Fact Sheet for Healthcare Providers: SeriousBroker.it  This test is not yet approved or cleared by the [place] FDA and has been authorized for detection and/or diagnosis of SARS-CoV-2 by FDA under an Emergency Use  Authorization (EUA). This EUA will remain in effect (meaning this test can be used) for the duration of the COVID-19 declaration under Section 564(b)(1) of the Act, 21 U.S.C. section 360bbb-3(b)(1), unless the authorization is terminated or revoked.  Performed at [facility], [address], [place], [place] [address]   Urine culture     Status: Abnormal   Collection Time: 03/07/21  6:59 PM   Specimen: Urine, Random  Result Value Ref Range Status   Specimen Description   Final  URINE, RANDOM Performed at [facility], [address] [place], [place] [address]    Special Requests   Final    NONE Performed at [facility], [address] [place], [place] [address]    Culture >=100,000 COLONIES/mL ESCHERICHIA COLI (A)  Final   Report Status 03/10/2021 FINAL  Final   Organism ID, Bacteria ESCHERICHIA COLI (A)  Final      Susceptibility   Escherichia coli - MIC*    AMPICILLIN <=2 SENSITIVE Sensitive     CEFAZOLIN <=4 SENSITIVE Sensitive     CEFEPIME <=0.12 SENSITIVE Sensitive     CEFTRIAXONE <=0.25 SENSITIVE Sensitive     CIPROFLOXACIN <=0.25 SENSITIVE Sensitive     GENTAMICIN <=1 SENSITIVE Sensitive     IMIPENEM <=0.25 SENSITIVE Sensitive     NITROFURANTOIN <=16 SENSITIVE Sensitive     TRIMETH/SULFA <=20 SENSITIVE Sensitive     AMPICILLIN/SULBACTAM <=2 SENSITIVE Sensitive     PIP/TAZO <=4 SENSITIVE Sensitive     * >=100,000 COLONIES/mL ESCHERICHIA COLI         Radiology Studies: No results found.      Scheduled Meds: . atorvastatin  40 mg Oral QHS  . bisacodyl  10 mg Rectal Daily  . Chlorhexidine Gluconate Cloth  6 each Topical Daily  . clopidogrel  75 mg Oral Daily  . docusate sodium  100 mg Oral BID  . doxepin  10 mg Oral QHS  . polyethylene glycol  17 g Oral Daily  . QUEtiapine  25 mg Oral QHS   Continuous Infusions:    LOS: 9 days    Time spent: 30 mins    Delfino Lovett, MD [facility] Pager [phone]  [phone]  If 7PM-7AM, please contact night-coverage 03/16/2021, 3:32 PM

## 2021-03-16 NOTE — Care Management Important Message (Signed)
Important Message  Patient Details  Name: Sara Conley MRN: [id number] Date of Birth: 1917-12-16   Medicare Important Message Given:  Yes  Talked with the daughter, Leeroy Cha as she visiting today and she signed the Important Message from Medicare and translated in [place] to her mother.  Stated she understood the form and I thanked her for her time.  Olegario Messier A Ethyle Tiedt 03/16/2021, 12:47 PM

## 2021-03-16 NOTE — Progress Notes (Signed)
Pt assisted back to bed, no complaints, bed low and locked.

## 2021-03-16 NOTE — Progress Notes (Signed)
Family / daughter asked if her mediations could be given with lunch. No BP medications , nurse agreed.  Family to call when pt's lunch arrives

## 2021-03-16 NOTE — Progress Notes (Signed)
Physical Therapy Treatment Patient Details Name: Sara Conley MRN: [id number] DOB: [birth year] Today's Date: 03/16/2021    History of Present Illness Pt is a 87 y.o. female presenting to hospital 5/4 s/p mechanical fall (tripped while using walker).  Imaging showing comminuted intertrochanteric L hip fx.  Likely UTI.  Pt s/p L IMN femoral for L hip fx 03/12/21.  PMH includes mild dementia, htn, HLD, coronary angioplasty with stent placement.    PT Comments    Pt sleeping in bed upon PT arrival but woken easily with gentle vc's and tactiles cues.  Discussed calling a [place] interpreter for pt but pt's daughter declined interpreter (pt's daughter reports being pt's POA); pt's daughter reports that an interpreter is not needed and she would interpret for pt during session (would work better with someone in person that pt knows than an interpreter on the phone d/t pt's confusion).  Mod to max assist semi-supine to sitting edge of bed; min to mod assist for sitting balance d/t posterior lean; max assist for stand pivot transfer bed to recliner towards L and then max assist to stand up to RW for about 30 seconds for hygiene care with pt's nurse (2nd assist present for transfers for safety).  Pt's daughter interpreting for pt during session but pt also responded fairly well with tactile and visual cues for activities.  Will continue to focus on strengthening and progressive functional mobility during hospitalization.   Follow Up Recommendations  SNF     Equipment Recommendations  Rolling walker with 5" wheels;3in1 (PT);Wheelchair (measurements PT);Wheelchair cushion (measurements PT);Hospital bed    Recommendations for Other Services OT consult     Precautions / Restrictions Precautions Precautions: Fall Restrictions Weight Bearing Restrictions: Yes LLE Weight Bearing: Weight bearing as tolerated    Mobility  Bed Mobility Overal bed mobility: Needs Assistance Bed Mobility: Supine to  Sit     Supine to sit: Mod assist;Max assist;HOB elevated     General bed mobility comments: assist for B LE's and trunk; vc's for technique/sequencing; increased assist required d/t L hip pain    Transfers Overall transfer level: Needs assistance Equipment used: None;Rolling walker (2 wheeled) Transfers: Sit to/from [employer] Sit to Stand: Max assist;+2 safety/equipment (2nd SBA for safety) Stand pivot transfers: Max assist;+2 safety/equipment (2nd SBA for safety)       General transfer comment: stand pivot transfer bed to recliner towards L with max assist x1; sit to stand up to RW max assist x1; vc's, tactile cues, and visual demo for technique  Ambulation/Gait             General Gait Details: pt unable to take any steps in place with RW use and 1 assist   Stairs             Wheelchair Mobility    Modified Rankin (Stroke Patients Only)       Balance Overall balance assessment: Needs assistance Sitting-balance support: Feet supported;Bilateral upper extremity supported Sitting balance-Leahy Scale: Poor Sitting balance - Comments: min assist to mod assist for sitting balance d/t posterior lean Postural control: Posterior lean Standing balance support: Bilateral upper extremity supported Standing balance-Leahy Scale: Poor Standing balance comment: 1 assist for standing balance with B UE support on RW                            Cognition Arousal/Alertness: Awake/alert Behavior During Therapy: WFL for tasks assessed/performed Overall Cognitive Status: Impaired/Different from baseline Area  of Impairment: Orientation;Attention;Memory;Following commands;Safety/judgement;Problem solving                 Orientation Level: Disoriented to;Place;Time;Situation Current Attention Level: Alternating Memory: Decreased recall of precautions Following Commands: Follows one step commands inconsistently;Follows one step commands with  increased time Safety/Judgement: Decreased awareness of safety;Decreased awareness of deficits   Problem Solving: Slow processing;Decreased initiation;Difficulty sequencing;Requires verbal cues;Requires tactile cues        Exercises      General Comments General comments (skin integrity, edema, etc.): L hip dressing in place.  Nursing cleared pt for participation in physical therapy.  Pt agreeable to PT session.      Pertinent Vitals/Pain Pain Assessment: Faces Faces Pain Scale: Hurts little more (4/10 at rest; 8/10 with activity) Pain Location: L hip/thigh Pain Descriptors / Indicators: Guarding;Operative site guarding;Grimacing Pain Intervention(s): Limited activity within patient's tolerance;Monitored during session;Repositioned;Patient requesting pain meds-RN notified  Vitals (HR and O2 on room air) stable and WFL throughout treatment session.    Home Living                      Prior Function            PT Goals (current goals can now be found in the care plan section) Acute Rehab PT Goals Patient Stated Goal: to get her stronger and better able to move PT Goal Formulation: With patient/family Time For Goal Achievement: 03/28/21 Potential to Achieve Goals: Fair Progress towards PT goals: Progressing toward goals    Frequency    7X/week      PT Plan Current plan remains appropriate    Co-evaluation              AM-PAC PT "6 Clicks" Mobility   Outcome Measure  Help needed turning from your back to your side while in a flat bed without using bedrails?: A Little Help needed moving from lying on your back to sitting on the side of a flat bed without using bedrails?: A Lot Help needed moving to and from a bed to a chair (including a wheelchair)?: A Lot Help needed standing up from a chair using your arms (e.g., wheelchair or bedside chair)?: A Lot Help needed to walk in hospital room?: Total Help needed climbing 3-5 steps with a railing? :  Total 6 Click Score: 11    End of Session Equipment Utilized During Treatment: Gait belt Activity Tolerance: Patient limited by pain Patient left: in chair;with call bell/phone within reach;with chair alarm set;with family/visitor present;with SCD's reapplied;Other (comment);with nursing/sitter in room (B heels floating via pillow support) Nurse Communication: Mobility status;Precautions;Weight bearing status;Patient requests pain meds PT Visit Diagnosis: Other abnormalities of gait and mobility (R26.89);Muscle weakness (generalized) (M62.81);History of falling (Z91.81);Difficulty in walking, not elsewhere classified (R26.2);Pain Pain - Right/Left: Left Pain - part of body: Hip     Time: 0922-0954 PT Time Calculation (min) (ACUTE ONLY): 32 min  Charges:  $Therapeutic Activity: 23-37 mins                     Hendricks Limes, PT 03/16/21, 1:29 PM

## 2021-03-16 NOTE — TOC Progression Note (Signed)
Transition of Care [facility]) - Progression Note    Patient Details  Name: Sara Conley MRN: [id number] Date of Birth: Oct 21, 1918  [facility]) CM/SW Contact  Barrie Dunker, RN Phone Number: 03/16/2021, 8:36 AM  Clinical Narrative:    Received a call from Judeth Cornfield at [facility] with the offer of peer to peer, I sent the Medical Director 's contact information to the physician to call to do the peer to peer as [phone], awaiting responce       Expected Discharge Plan and Services                                                 Social Determinants of Health (SDOH) Interventions    Readmission Risk Interventions No flowsheet data found.

## 2021-03-16 NOTE — Progress Notes (Signed)
Occupational Therapy Treatment Patient Details Name: Sara Conley MRN: [id number] DOB: 02/16/1918 Today's Date: 03/16/2021    History of present illness Pt is a 87 y.o. female presenting to hospital 5/4 s/p mechanical fall (tripped while using walker).  Imaging showing comminuted intertrochanteric L hip fx.  Likely UTI.  Pt s/p L IMN femoral for L hip fx 03/12/21.  PMH includes mild dementia, htn, HLD, coronary angioplasty with stent placement.   OT comments  Pt seen for OT treatment this date to f/u re: safety with ADLs/ADL mobility. Pt generally appears to be mentating better this date, she is more responsive, more alert, and generally attends to task better this session. Visual/tactile cues primarily utilized as sicilian interpreter unable to be reached and pt's daughter is not available/present. OT engages pt in sup to sit with MOD A with HOB elevated. Pt demos P static sitting balance. Attempted STS with RW with MOD/MAX A with little success in getting pt to extend hips and keep feet under her to act as BOS. Pt more successful with arm in arm method to SPS to/from [facility] With MAX A. Pt requires MIN visual/tactile cues for safe hand placement. OT educates pt re: sit/lateral lean method for posterior peri care and leaning toward her R to avoid hip pain. Pt with minimal reception/carryover, but does attempt to participate at the time, requires MAX A for thorough completion. She is able to perform anterior peri care in sitting with SETUP. Pt back to bed at end of session. MAX A for repositioning. Pt left with all needs met and in reach. Bed alarm set. Pt did demonstrate some increased tolerance for OT treatment this date, but still requires extensive assistance versus her baseline, making STR f/u upon d/c from the hospital, the most appropriate recommendation at this time.    Follow Up Recommendations  SNF    Equipment Recommendations  3 in 1 bedside commode;Tub/shower seat;Other (comment)     Recommendations for Other Services      Precautions / Restrictions Precautions Precautions: Fall Restrictions Weight Bearing Restrictions: Yes LLE Weight Bearing: Weight bearing as tolerated       Mobility Bed Mobility Overal bed mobility: Needs Assistance Bed Mobility: Supine to Sit;Sit to Supine     Supine to sit: Mod assist;HOB elevated Sit to supine: Max assist;HOB elevated   General bed mobility comments: increased assist for LEs back to bed from sitting    Transfers Overall transfer level: Needs assistance Equipment used: 1 person hand held assist Transfers: Stand Pivot Transfers Sit to Stand: Max assist;+2 safety/equipment (2nd SBA for safety) Stand pivot transfers: Max assist       General transfer comment: MAX A with arm in arm technique for SPS both to and from [facility]. Pt requires MIN tactile/visual cues to assume correct hand placement for arm in arm transfer and tolerates better yesterday with decreased groaning/moanind 2/2 pain. Overall improved tolerance.    Balance Overall balance assessment: Needs assistance Sitting-balance support: Feet supported;Bilateral upper extremity supported Sitting balance-Leahy Scale: Poor Sitting balance - Comments: requires MIN A to sistain, demos R lateral or posterior lean Postural control: Posterior lean Standing balance support: Bilateral upper extremity supported Standing balance-Leahy Scale: Zero Standing balance comment: MAX A and b/l UE support to sustain static stand, does not extend into full stand, seemingly d/t pain                           ADL either performed or assessed  with clinical judgement   ADL Overall ADL's : Needs assistance/impaired     Grooming: Wash/dry hands;Minimal assistance;Sitting Grooming Details (indicate cue type and reason): visual cues to sequence, but more volition with initiating familiar tasks                 Toilet Transfer: Moderate assistance;Maximal  assistance;Stand-pivot;BSC Toilet Transfer Details (indicate cue type and reason): arm in arm Toileting- Clothing Manipulation and Hygiene: Maximal assistance;Sitting/lateral lean Toileting - Clothing Manipulation Details (indicate cue type and reason): cues to perform modified technique with sit/lateral lean for posterior peri care. Pt able to perform anterior peri care in sitting with SETUP.             Vision Patient Visual Report: No change from baseline     Perception     Praxis      Cognition Arousal/Alertness: Awake/alert Behavior During Therapy: WFL for tasks assessed/performed Overall Cognitive Status: Impaired/Different from baseline Area of Impairment: Orientation;Attention;Memory;Following commands;Safety/judgement;Problem solving                 Orientation Level: Disoriented to;Place;Time;Situation Current Attention Level: Sustained Memory: Decreased recall of precautions Following Commands: Follows one step commands with increased time Safety/Judgement: Decreased awareness of safety;Decreased awareness of deficits   Problem Solving: Slow processing;Difficulty sequencing;Requires verbal cues;Requires tactile cues General Comments: Pt with improved mentation during OT session this date, better able to follow commands and contribute to mobility. Still requring significant cueing, but demos more effort to initiate tasks when visually prompted to do something familiar such as wash hands.        Exercises Other Exercises Other Exercises: OT facilitates pt participation in SPS to/from [facility] and toileting tasks with MAX A with sit/lateral lean technique, encouraging pt to lean to her R to avoid L hip pain.   Shoulder Instructions       General Comments L hip dressing in place    Pertinent Vitals/ Pain       Pain Assessment: Faces Faces Pain Scale: Hurts little more Pain Location: L hip/thigh Pain Descriptors / Indicators: Guarding;Operative site  guarding;Grimacing Pain Intervention(s): Limited activity within patient's tolerance;Monitored during session;Repositioned  Home Living                                          Prior Functioning/Environment              Frequency  Min 2X/week        Progress Toward Goals  OT Goals(current goals can now be found in the care plan section)  Progress towards OT goals: Progressing toward goals  Acute Rehab OT Goals Patient Stated Goal: to get her stronger and better able to move OT Goal Formulation: With family Time For Goal Achievement: 03/28/21 Potential to Achieve Goals: [place] Discharge plan remains appropriate    Co-evaluation                 AM-PAC OT "6 Clicks" Daily Activity     Outcome Measure   Help from another person eating meals?: A Little Help from another person taking care of personal grooming?: A Little Help from another person toileting, which includes using toliet, bedpan, or urinal?: A Lot Help from another person bathing (including washing, rinsing, drying)?: A Lot Help from another person to put on and taking off regular upper body clothing?: A Lot Help from another person to put  on and taking off regular lower body clothing?: Total 6 Click Score: 13    End of Session Equipment Utilized During Treatment: Gait belt  OT Visit Diagnosis: Unsteadiness on feet (R26.81);Muscle weakness (generalized) (M62.81);Pain Pain - Right/Left: Left Pain - part of body: Hip   Activity Tolerance Patient tolerated treatment well   Patient Left with call bell/phone within reach;in bed;with bed alarm set   Nurse Communication Mobility status;Other (comment) (notified CNA that pt had large formed BM during session)        Time: 2025-4270 OT Time Calculation (min): 23 min  Charges: OT General Charges $OT Visit: 1 Visit OT Treatments $Self Care/Home Management : 8-22 mins $Therapeutic Activity: 8-22 mins  Gerrianne Scale, MS,  OTR/L ascom [phone] 03/16/21, 1:54 PM

## 2021-03-16 NOTE — Progress Notes (Signed)
  Subjective:  Patient reports pain as mild.    Objective:   VITALS:   Vitals:   03/15/21 0746 03/15/21 1142 03/16/21 0517 03/16/21 0836  BP: (!) 102/45 (!) 107/50 (!) 144/61 132/84  Pulse: 81 91 78 89  Resp: 15 18  16   Temp: 97.6 F (36.4 C) 97.7 F (36.5 C) (!) 97.5 F (36.4 C) 98.6 F (37 C)  TempSrc: Oral Oral Oral   SpO2: 94% 93%  98%  Weight:      Height:        PHYSICAL EXAM:  Neurologically intact ABD soft Neurovascular intact Sensation intact distally Intact pulses distally Dorsiflexion/Plantar flexion intact Incision: scant drainage and drsg changed today No cellulitis present Compartment soft  LABS  No results found for this or any previous visit (from the past 24 hour(s)).  No results found.  Assessment/Plan: 4 Days Post-Op   Principal Problem:   left Hip fracture (HCC) Active Problems:   Anxiety and depression   Coronary artery disease involving native coronary artery of native heart without angina pectoris   Essential hypertension   Other insomnia   Pure hypercholesterolemia   Valvular heart disease   Acute lower UTI   Advance diet Up with therapy WBATLLE Discharge per medicine SNF Continue homePlavix Drsg changed today Continue hydrocodone for pain control, RX in chart Follow up in 2 weeks for staple removal call office to confirm appt [phone]   [phone] , PA-C 03/16/2021, 3:15 PM

## 2021-03-16 NOTE — Progress Notes (Addendum)
Pt boosted up in the chair with the assistance of Physical therapy. Pt is in chair verbally upset once she is resituated she is calm. Pt resting in bed, calm, bed low and locked.

## 2021-03-17 DIAGNOSIS — N390 Urinary tract infection, site not specified: Secondary | ICD-10-CM | POA: Diagnosis not present

## 2021-03-17 DIAGNOSIS — F419 Anxiety disorder, unspecified: Secondary | ICD-10-CM | POA: Diagnosis not present

## 2021-03-17 DIAGNOSIS — S72002A Fracture of unspecified part of neck of left femur, initial encounter for closed fracture: Secondary | ICD-10-CM | POA: Diagnosis not present

## 2021-03-17 DIAGNOSIS — I2510 Atherosclerotic heart disease of native coronary artery without angina pectoris: Secondary | ICD-10-CM | POA: Diagnosis not present

## 2021-03-17 MED ORDER — LORAZEPAM 2 MG/ML IJ SOLN
0.5000 mg | Freq: Once | INTRAMUSCULAR | Status: AC
  Administered 2021-03-17: 0.5 mg via INTRAVENOUS
  Filled 2021-03-17: qty 1

## 2021-03-17 NOTE — Progress Notes (Signed)
Physical Therapy Treatment Patient Details Name: Sara Conley MRN: [id number] DOB: 1918/06/24 Today's Date: 03/17/2021    History of Present Illness Pt is a 87 y.o. female presenting to hospital 5/4 s/p mechanical fall (tripped while using walker).  Imaging showing comminuted intertrochanteric L hip fx.  Likely UTI.  Pt s/p L IMN femoral for L hip fx 03/12/21.  PMH includes mild dementia, htn, HLD, coronary angioplasty with stent placement.    PT Comments    Pt was long sitting in bed with supportive daughter at bedside. Author was unable to find Network engineer for session however daughter agreed to translate. Per daughter," She is confused but willing to try anything." Pt was able to exit L side of bed with mod assist + a lot of time. Sat EOB for several minutes prior to standing and ambulating to [facility]. Successfully urinated prior to standing and ambulating around bed. Pt does require constant assistance during ambulation but overall tolerated well. No yelling out in pain today like previously observed. Pt gives great effort and is cooperative throughout. Pt was repositioned in bed post session with bed alarm placed and call bell in reach. RN staff aware of pt's abilities. Highly recommend DC to SNF to address deficits while maximizing independence with ADLs.     Follow Up Recommendations  SNF     Equipment Recommendations  Rolling walker with 5" wheels;3in1 (PT);Wheelchair (measurements PT);Wheelchair cushion (measurements PT);Hospital bed       Precautions / Restrictions Precautions Precautions: Fall Restrictions Weight Bearing Restrictions: Yes LLE Weight Bearing: Weight bearing as tolerated    Mobility  Bed Mobility Overal bed mobility: Needs Assistance Bed Mobility: Supine to Sit;Sit to Supine     Supine to sit: Mod assist;HOB elevated Sit to supine: Max assist;HOB elevated   General bed mobility comments: Increased time to perform hjowever per daughter much less pain  limited than previous date.    Transfers Overall transfer level: Needs assistance Equipment used: Rolling walker (2 wheeled) Transfers: Sit to/from Stand Sit to Stand: Mod assist;From elevated surface         General transfer comment: Pt was able to STS 4 x throughout session. 2 x from EOB, 1 x form recliner, and 1 x from [facility]  Ambulation/Gait Ambulation/Gait assistance: Mod assist;+2 safety/equipment Gait Distance (Feet): 8 Feet Assistive device: Rolling walker (2 wheeled) Gait Pattern/deviations: Step-to pattern Gait velocity: decreased   General Gait Details: Pt ambulated 3 x with RW. 2 x 5 ft and one x 8 ft with mod assist of one. pt ambulates with narrow BOS and slight scissoring. posterior LOB noted. Overall tolerated gait training well.     Balance Overall balance assessment: Needs assistance Sitting-balance support: Feet supported;Bilateral upper extremity supported Sitting balance-Leahy Scale: Poor     Standing balance support: Bilateral upper extremity supported Standing balance-Leahy Scale: Poor Standing balance comment: posterior push noted       Cognition Arousal/Alertness: Awake/alert Behavior During Therapy: WFL for tasks assessed/performed Overall Cognitive Status: Difficult to assess (langage barrier make assessing cognition difficult) Area of Impairment: Orientation;Attention;Memory;Following commands;Safety/judgement;Problem solving    Following Commands: Follows one step commands with increased time Safety/Judgement: Decreased awareness of safety;Decreased awareness of deficits   Problem Solving: Slow processing;Difficulty sequencing;Requires verbal cues;Requires tactile cues General Comments: Per pt's daughter who translated session due to no [place] interpretor through [employer]             Pertinent Vitals/Pain Pain Assessment: Faces Faces Pain Scale: Hurts a little bit Pain Location: L  hip/thigh Pain Descriptors / Indicators:  Guarding;Operative site guarding;Grimacing Pain Intervention(s): Limited activity within patient's tolerance;Monitored during session;Premedicated before session;Repositioned           PT Goals (current goals can now be found in the care plan section) Acute Rehab PT Goals Patient Stated Goal: none stated Progress towards PT goals: Progressing toward goals    Frequency    7X/week      PT Plan Current plan remains appropriate       AM-PAC PT "6 Clicks" Mobility   Outcome Measure  Help needed turning from your back to your side while in a flat bed without using bedrails?: A Little Help needed moving from lying on your back to sitting on the side of a flat bed without using bedrails?: A Lot Help needed moving to and from a bed to a chair (including a wheelchair)?: A Lot Help needed standing up from a chair using your arms (e.g., wheelchair or bedside chair)?: A Lot Help needed to walk in hospital room?: A Lot Help needed climbing 3-5 steps with a railing? : Total 6 Click Score: 12    End of Session Equipment Utilized During Treatment: Gait belt Activity Tolerance: Patient tolerated treatment well Patient left: in bed;with call bell/phone within reach;with bed alarm set;with family/visitor present;with SCD's reapplied Nurse Communication: Mobility status;Precautions;Weight bearing status PT Visit Diagnosis: Other abnormalities of gait and mobility (R26.89);Muscle weakness (generalized) (M62.81);History of falling (Z91.81);Difficulty in walking, not elsewhere classified (R26.2);Pain Pain - Right/Left: Left Pain - part of body: Hip     Time: 7628-3151 PT Time Calculation (min) (ACUTE ONLY): 26 min  Charges:  $Gait Training: 8-22 mins $Therapeutic Activity: 8-22 mins                     Jetta Lout PTA 03/17/21, 11:46 AM

## 2021-03-17 NOTE — Plan of Care (Signed)

## 2021-03-17 NOTE — TOC Progression Note (Signed)
Transition of Care [facility]) - Progression Note    Patient Details  Name: Sara Conley MRN: [id number] Date of Birth: 09-28-1918  [facility] CM/SW Contact  Maud Deed, LCSW Phone Number: 03/17/2021, 3:32 PM  Clinical Narrative:    HTA declined SNF stay for pt, pt's family decided to appeal.        Expected Discharge Plan and Services                                                 Social Determinants of Health (SDOH) Interventions    Readmission Risk Interventions No flowsheet data found.
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
## 2021-03-17 NOTE — Progress Notes (Signed)
PROGRESS NOTE    Sara Conley  GLO:[id number] DOB: [birth year] DOA: 03/07/2021 PCP: Marisue Ivan, MD    HPI was taken from Dr. Mikeal Hawthorne:  Sara Conley is a 87 y.o. female with medical history significant of hypertension, hyperlipidemia, dementia, who only speaks [place] brought in by her daughter after sustaining a mechanical fall at home and having pain on her left side.  She tripped low while using her walker in the restroom.  She fell on her left side.  There was severe pain in the area and she called out to her daughter.  Patient was seen in the ER and has left intertrochanteric fracture.  Orthopedics consulted but patient is on Plavix.  Recommendation is for admission and getting patient ready for surgery probably in about 3 days.  She did not hit her head.  Patient is frail but has been apparently doing well in her usual state of health.  Able to do her ADLs according to the daughter.  She was using the walker but can move around the house.  She otherwise has no other complaints.  No other areas of injury..  ED Course: Temperature is 98.6 initial blood pressure 207/66 with pulse 59 respirate of 16 oxygen sat 87% on room air.  Currently 100%.  White count is 11.1 hemoglobin 10.7 platelets of 189.  Chemistry mostly within normal except BUN 35 calcium 8.7.  Glucose of 115.  Viral screen including COVID-19 is negative urinalysis showed WBC 16 with rare bacteria and positive nitrite.  Head CT without contrast showed no acute findings.  Chest x-ray showed no significant findings.  X-ray of the left hip showed comminuted intertrochanteric left hip fracture.  Patient being admitted to the hospital for further evaluation and treatment.   Hospital course from Dr. Mayford Knife 5/5-5/10/22: Pt presented w/ left hip fracture secondary to fall at home. Pt is s/p left intramedullary nail on 03/12/21 as per ortho surg. PT/OT should see the pt today. Pt's daughter is hopeful that the pt will go to SNF after  d/c. Also, pt has UTI and is on IV rocephin    5/11-PT, OT recommended SNF.  Waiting for insurance Auth 5/12 -insurance declined.  Patient was very sleepy likely from narcotic and benzos. 5/13 -waiting on insurance appeal.  PT and OT reeval still recommending SNF.  Patient awake and alert today  Assessment & Plan:   Principal Problem:   left Hip fracture (HCC) Active Problems:   Anxiety and depression   Coronary artery disease involving native coronary artery of native heart without angina pectoris   Essential hypertension   Other insomnia   Pure hypercholesterolemia   Valvular heart disease   Acute lower UTI   Pressure injury of skin   Left intertrochanteric fracture: s/p mechanical fall.  Continue plavix. S/p intramedullary nail on 03/12/21.  Family is requesting no narcotic pain medicine  HTN: Blood pressure soft will hold metoprolol  Hyperkalemia: likely secondary to ARB use. Lokelma x 1.  Normalized  HLD: continue on statin   E. Coli UTI: completed course of Abx. Urine cx is growing e. coli   Hx of CAD: continue on home dose of metoprolol,statin. Continue to hold ARB secondary to hyperkalemia. Restarted plavix    Dementia: continue w/ supportive care. Continue on seroquel qhs   Thrombocytopenia: resolved   Macrocytic anemia: folate, B12 are WNL. H&H are stable   Leukocytosis: labile. Will continue to monitor    DVT prophylaxis: lovenox  Code Status: full  Family Communication: discussed pt's  care w/ pt's daughter at bedside and answered her questions on 5/14 Disposition Plan: SNF  Level of care: Med-Surg   Status is: Inpatient  Remains inpatient appropriate because:Ongoing diagnostic testing needed not appropriate for outpatient work up, Unsafe d/c plan, IV treatments appropriate due to intensity of illness or inability to take PO and Inpatient level of care appropriate due to severity of illness   Dispo: The patient is from: Home              Anticipated  d/c is to: SNF versus home with hospice              Patient currently is not medically stable to d/c.   Difficult to place patient: Patient's family is now appealing with her insurance declining SNF    Consultants:   Ortho surg  Procedures:  Hip surgery  Antimicrobials:   Subjective: No new issues.  Daughter at bedside.  She is now wants to appeal insurance denial for SNF  Objective: Vitals:   03/16/21 2007 03/17/21 0057 03/17/21 0528 03/17/21 0847  BP: 140/69 (!) 125/51 (!) 152/60 (!) 139/59  Pulse: 97 93 (!) 102 92  Resp: 18 15 18 18   Temp: 98.5 F (36.9 C) 97.6 F (36.4 C) 97.6 F (36.4 C) 98 F (36.7 C)  TempSrc:      SpO2: 97% 94% 96% 95%  Weight:      Height:        Intake/Output Summary (Last 24 hours) at 03/17/2021 1403 Last data filed at 03/17/2021 1350 Gross per 24 hour  Intake 480 ml  Output --  Net 480 ml   Filed Weights   03/07/21 1717  Weight: 42.6 kg    Examination:  General exam: Frail appearing. Appears comfortable  Respiratory system: clear breath sounds b/l. No rales  Cardiovascular system: S1/S2+. No gallops or rubs  Gastrointestinal system: Abd is soft, NT, ND & normal bowel sounds  Central nervous system:  nonfocal exam.  Awake and alert Psychiatry: Normal mood and affect    Data Reviewed: I have personally reviewed following labs and imaging studies  CBC: Recent Labs  Lab 03/11/21 0509 03/12/21 0453 03/12/21 2229 03/13/21 0620 03/14/21 0539 03/15/21 0543  WBC 12.0* 11.8*  --  11.2* 10.2 8.0  HGB 8.2* 8.5* 8.7* 8.5* 7.2* 7.7*  HCT 25.1* 26.2* 27.4* 26.8* 21.7* 23.6*  MCV 97.7 99.2  --  99.3 96.9 97.5  PLT 129* 147*  --  158 153 158   Basic Metabolic Panel: Recent Labs  Lab 03/11/21 0509 03/12/21 0453 03/13/21 0620 03/13/21 1617 03/14/21 0539 03/15/21 0543  NA 133* 135 136  --  135 135  K 4.0 4.2 5.4* 4.7 4.5 4.2  CL 101 101 99  --  101 102  CO2 27 25 24   --  26 25  GLUCOSE 107* 94 128*  --  107* 100*  BUN 18  25* 49*  --  67* 63*  CREATININE 0.59 0.80 1.51*  --  1.75* 1.48*  CALCIUM 8.2* 8.1* 8.4*  --  8.0* 8.1*   GFR: Estimated Creatinine Clearance: 13 mL/min (A) (by C-G formula based on SCr of 1.48 mg/dL (H)). Liver Function Tests: No results for input(s): AST, ALT, ALKPHOS, BILITOT, PROT, ALBUMIN in the last 168 hours. No results for input(s): LIPASE, AMYLASE in the last 168 hours. No results for input(s): AMMONIA in the last 168 hours. Coagulation Profile: No results for input(s): INR, PROTIME in the last 168 hours. Cardiac Enzymes: No results  for input(s): CKTOTAL, CKMB, CKMBINDEX, TROPONINI in the last 168 hours. BNP (last 3 results) No results for input(s): PROBNP in the last 8760 hours. HbA1C: No results for input(s): HGBA1C in the last 72 hours. CBG: No results for input(s): GLUCAP in the last 168 hours. Lipid Profile: No results for input(s): CHOL, HDL, LDLCALC, TRIG, CHOLHDL, LDLDIRECT in the last 72 hours. Thyroid Function Tests: No results for input(s): TSH, T4TOTAL, FREET4, T3FREE, THYROIDAB in the last 72 hours. Anemia Panel: No results for input(s): VITAMINB12, FOLATE, FERRITIN, TIBC, IRON, RETICCTPCT in the last 72 hours. Sepsis Labs: No results for input(s): PROCALCITON, LATICACIDVEN in the last 168 hours.  Recent Results (from the past 240 hour(s))  Resp Panel by RT-PCR (Flu A&B, Covid) Nasopharyngeal Swab     Status: None   Collection Time: 03/07/21  6:59 PM   Specimen: Nasopharyngeal Swab; Nasopharyngeal(NP) swabs in vial transport medium  Result Value Ref Range Status   SARS Coronavirus 2 by RT PCR NEGATIVE NEGATIVE Final    Comment: (NOTE) SARS-CoV-2 target nucleic acids are NOT DETECTED.  The SARS-CoV-2 RNA is generally detectable in upper respiratory specimens during the acute phase of infection. The lowest concentration of SARS-CoV-2 viral copies this assay can detect is 138 copies/mL. A negative result does not preclude SARS-Cov-2 infection and should  not be used as the sole basis for treatment or other patient management decisions. A negative result may occur with  improper specimen collection/handling, submission of specimen other than nasopharyngeal swab, presence of viral mutation(s) within the areas targeted by this assay, and inadequate number of viral copies(<138 copies/mL). A negative result must be combined with clinical observations, patient history, and epidemiological information. The expected result is Negative.  Fact Sheet for Patients:  BloggerCourse.com  Fact Sheet for Healthcare Providers:  SeriousBroker.it  This test is no t yet approved or cleared by the [place] FDA and  has been authorized for detection and/or diagnosis of SARS-CoV-2 by FDA under an Emergency Use Authorization (EUA). This EUA will remain  in effect (meaning this test can be used) for the duration of the COVID-19 declaration under Section 564(b)(1) of the Act, 21 U.S.C.section 360bbb-3(b)(1), unless the authorization is terminated  or revoked sooner.       Influenza A by PCR NEGATIVE NEGATIVE Final   Influenza B by PCR NEGATIVE NEGATIVE Final    Comment: (NOTE) The Xpert Xpress SARS-CoV-2/FLU/RSV plus assay is intended as an aid in the diagnosis of influenza from Nasopharyngeal swab specimens and should not be used as a sole basis for treatment. Nasal washings and aspirates are unacceptable for Xpert Xpress SARS-CoV-2/FLU/RSV testing.  Fact Sheet for Patients: BloggerCourse.com  Fact Sheet for Healthcare Providers: SeriousBroker.it  This test is not yet approved or cleared by the [place] FDA and has been authorized for detection and/or diagnosis of SARS-CoV-2 by FDA under an Emergency Use Authorization (EUA). This EUA will remain in effect (meaning this test can be used) for the duration of the COVID-19 declaration under  Section 564(b)(1) of the Act, 21 U.S.C. section 360bbb-3(b)(1), unless the authorization is terminated or revoked.  Performed at [facility], [address] [place], [place] [address]   Urine culture     Status: Abnormal   Collection Time: 03/07/21  6:59 PM   Specimen: Urine, Random  Result Value Ref Range Status   Specimen Description   Final    URINE, RANDOM Performed at [facility], [address], [place], [place] [address]  Special Requests   Final    NONE Performed at [facility], [address] [place], [place] [address]    Culture >=100,000 COLONIES/mL ESCHERICHIA COLI (A)  Final   Report Status 03/10/2021 FINAL  Final   Organism ID, Bacteria ESCHERICHIA COLI (A)  Final      Susceptibility   Escherichia coli - MIC*    AMPICILLIN <=2 SENSITIVE Sensitive     CEFAZOLIN <=4 SENSITIVE Sensitive     CEFEPIME <=0.12 SENSITIVE Sensitive     CEFTRIAXONE <=0.25 SENSITIVE Sensitive     CIPROFLOXACIN <=0.25 SENSITIVE Sensitive     GENTAMICIN <=1 SENSITIVE Sensitive     IMIPENEM <=0.25 SENSITIVE Sensitive     NITROFURANTOIN <=16 SENSITIVE Sensitive     TRIMETH/SULFA <=20 SENSITIVE Sensitive     AMPICILLIN/SULBACTAM <=2 SENSITIVE Sensitive     PIP/TAZO <=4 SENSITIVE Sensitive     * >=100,000 COLONIES/mL ESCHERICHIA COLI         Radiology Studies: No results found.      Scheduled Meds: . atorvastatin  40 mg Oral QHS  . bisacodyl  10 mg Rectal Daily  . Chlorhexidine Gluconate Cloth  6 each Topical Daily  . clopidogrel  75 mg Oral Daily  . docusate sodium  100 mg Oral BID  . doxepin  10 mg Oral QHS  . polyethylene glycol  17 g Oral Daily  . QUEtiapine  25 mg Oral QHS   Continuous Infusions:    LOS: 10 days    Time spent: 30 mins    Delfino LovettVipul Yuridia Couts, MD [facility] Pager [phone]  If 7PM-7AM, please contact night-coverage 03/17/2021, 2:03 PM

## 2021-03-18 DIAGNOSIS — F419 Anxiety disorder, unspecified: Secondary | ICD-10-CM | POA: Diagnosis not present

## 2021-03-18 DIAGNOSIS — S72002A Fracture of unspecified part of neck of left femur, initial encounter for closed fracture: Secondary | ICD-10-CM | POA: Diagnosis not present

## 2021-03-18 DIAGNOSIS — N390 Urinary tract infection, site not specified: Secondary | ICD-10-CM | POA: Diagnosis not present

## 2021-03-18 DIAGNOSIS — I2510 Atherosclerotic heart disease of native coronary artery without angina pectoris: Secondary | ICD-10-CM | POA: Diagnosis not present

## 2021-03-18 NOTE — TOC Progression Note (Signed)
Transition of Care [facility]) - Progression Note    Patient Details  Name: Sara Conley MRN: [id number] Date of Birth: 12-08-1917  Transition of Care [facility]) CM/SW Contact  Maud Deed, LCSW Phone Number: 03/18/2021, 3:17 PM  Clinical Narrative:    Family appealed HTA decision to decline SNF, awaiting updated ed decision. In the meantime CSW submitted home hopsice referral to [facility] per family and MD's request..          Expected Discharge Plan and Services                                                 Social Determinants of Health (SDOH) Interventions    Readmission Risk Interventions No flowsheet data found.

## 2021-03-18 NOTE — Progress Notes (Signed)
PT Cancellation Note  Patient Details Name: Sara Conley MRN: [id number] DOB: [birth year]   Cancelled Treatment:    Reason Eval/Treat Not Completed: Fatigue/lethargy limiting ability to participate   Attempted x 2 this am but pt sound asleep.  Daughter in room asking pt to be left sleeping.  On second attempt daughter stated she had ativan and LPN nodded.  Anticipate pt will be lethargic most of day.  Will check back later but anticipate continuing session tomorrow.   Danielle Dess 03/18/2021, 10:44 AM

## 2021-03-18 NOTE — Plan of Care (Signed)

## 2021-03-18 NOTE — Progress Notes (Signed)
PROGRESS NOTE    Sara Conley  LGX:[id number] DOB: Jan 31, 1918 DOA: 03/07/2021 PCP: Marisue Ivan, MD    HPI was taken from Dr. Mikeal Hawthorne:  Sara Conley is a 87 y.o. female with medical history significant of hypertension, hyperlipidemia, dementia, who only speaks [place] brought in by her daughter after sustaining a mechanical fall at home and having pain on her left side.  She tripped low while using her walker in the restroom.  She fell on her left side.  There was severe pain in the area and she called out to her daughter.  Patient was seen in the ER and has left intertrochanteric fracture.  Orthopedics consulted but patient is on Plavix.  Recommendation is for admission and getting patient ready for surgery probably in about 3 days.  She did not hit her head.  Patient is frail but has been apparently doing well in her usual state of health.  Able to do her ADLs according to the daughter.  She was using the walker but can move around the house.  She otherwise has no other complaints.  No other areas of injury..  ED Course: Temperature is 98.6 initial blood pressure 207/66 with pulse 59 respirate of 16 oxygen sat 87% on room air.  Currently 100%.  White count is 11.1 hemoglobin 10.7 platelets of 189.  Chemistry mostly within normal except BUN 35 calcium 8.7.  Glucose of 115.  Viral screen including COVID-19 is negative urinalysis showed WBC 16 with rare bacteria and positive nitrite.  Head CT without contrast showed no acute findings.  Chest x-ray showed no significant findings.  X-ray of the left hip showed comminuted intertrochanteric left hip fracture.  Patient being admitted to the hospital for further evaluation and treatment.   Hospital course from Dr. Mayford Knife 5/5-5/10/22: Pt presented w/ left hip fracture secondary to fall at home. Pt is s/p left intramedullary nail on 03/12/21 as per ortho surg. PT/OT should see the pt today. Pt's daughter is hopeful that the pt will go to SNF after  d/c. Also, pt has UTI and is on IV rocephin    5/11-PT, OT recommended SNF.  Waiting for insurance Auth 5/12 -insurance declined.  Patient was very sleepy likely from narcotic and benzos. 5/13 -waiting on insurance appeal.  PT and OT reeval still recommending SNF.  Patient awake and alert today 5/14 -insurance declined SNF.  Family appealed 5/15 -offered to consider hospice at home but family wants to wait for insurance appeal which is pending  Assessment & Plan:   Principal Problem:   left Hip fracture (HCC) Active Problems:   Anxiety and depression   Coronary artery disease involving native coronary artery of native heart without angina pectoris   Essential hypertension   Other insomnia   Pure hypercholesterolemia   Valvular heart disease   Acute lower UTI   Pressure injury of skin   Left intertrochanteric fracture: s/p mechanical fall.  Continue plavix. S/p intramedullary nail on 03/12/21.  Family is requesting no narcotic pain medicine  HTN: Blood pressure soft will hold metoprolol  Hyperkalemia: likely secondary to ARB use. Lokelma x 1.  Normalized  HLD: continue on statin   E. Coli UTI: completed course of Abx. Urine cx is growing e. coli   Hx of CAD: continue on home dose of metoprolol,statin. Continue to hold ARB secondary to hyperkalemia. Restarted plavix    Dementia: continue w/ supportive care. Continue on seroquel qhs   Thrombocytopenia: resolved   Macrocytic anemia: folate, B12 are WNL. H&H  are stable   Leukocytosis: labile. Will continue to monitor    DVT prophylaxis: lovenox  Code Status: full  Family Communication: discussed pt's care w/ pt's daughter at bedside and answered her questions on 5/15 Disposition Plan: SNF  Level of care: Med-Surg   Status is: Inpatient  Remains inpatient appropriate because:Ongoing diagnostic testing needed not appropriate for outpatient work up, Unsafe d/c plan, IV treatments appropriate due to intensity of illness or  inability to take PO and Inpatient level of care appropriate due to severity of illness   Dispo: The patient is from: Home              Anticipated d/c is to: SNF versus home with hospice              Patient currently is not medically stable to d/c.   Difficult to place patient: Patient's family has appealed with her insurance declining SNF    Consultants:   Ortho surg  Procedures:  Hip surgery  Antimicrobials:   Subjective: Patient was somewhat agitated and required Ativan.  Now sleepy.  Daughter at bedside  Objective: Vitals:   03/17/21 0847 03/17/21 1638 03/18/21 0449 03/18/21 0830  BP: (!) 139/59 132/61 136/63 (!) 142/48  Pulse: 92 (!) 105 91 99  Resp: 18 16 20 18   Temp: 98 F (36.7 C) (!) 97.5 F (36.4 C) 98.2 F (36.8 C) 98.3 F (36.8 C)  TempSrc:      SpO2: 95% 98% 100% 100%  Weight:      Height:        Intake/Output Summary (Last 24 hours) at 03/18/2021 1315 Last data filed at 03/18/2021 1012 Gross per 24 hour  Intake 240 ml  Output --  Net 240 ml   Filed Weights   03/07/21 1717  Weight: 42.6 kg    Examination:  General exam: Frail appearing. Appears comfortable  Respiratory system: clear breath sounds b/l. No rales  Cardiovascular system: S1/S2+. No gallops or rubs  Gastrointestinal system: Abd is soft, NT, ND & normal bowel sounds  Central nervous system:  nonfocal exam.  Sleepy status post Ativan last night Psychiatry: Sleepy    Data Reviewed: I have personally reviewed following labs and imaging studies  CBC: Recent Labs  Lab 03/12/21 0453 03/12/21 2229 03/13/21 0620 03/14/21 0539 03/15/21 0543  WBC 11.8*  --  11.2* 10.2 8.0  HGB 8.5* 8.7* 8.5* 7.2* 7.7*  HCT 26.2* 27.4* 26.8* 21.7* 23.6*  MCV 99.2  --  99.3 96.9 97.5  PLT 147*  --  158 153 158   Basic Metabolic Panel: Recent Labs  Lab 03/12/21 0453 03/13/21 0620 03/13/21 1617 03/14/21 0539 03/15/21 0543  NA 135 136  --  135 135  K 4.2 5.4* 4.7 4.5 4.2  CL 101 99  --   101 102  CO2 25 24  --  26 25  GLUCOSE 94 128*  --  107* 100*  BUN 25* 49*  --  67* 63*  CREATININE 0.80 1.51*  --  1.75* 1.48*  CALCIUM 8.1* 8.4*  --  8.0* 8.1*   GFR: Estimated Creatinine Clearance: 13 mL/min (A) (by C-G formula based on SCr of 1.48 mg/dL (H)). Liver Function Tests: No results for input(s): AST, ALT, ALKPHOS, BILITOT, PROT, ALBUMIN in the last 168 hours. No results for input(s): LIPASE, AMYLASE in the last 168 hours. No results for input(s): AMMONIA in the last 168 hours. Coagulation Profile: No results for input(s): INR, PROTIME in the last 168 hours. Cardiac  Enzymes: No results for input(s): CKTOTAL, CKMB, CKMBINDEX, TROPONINI in the last 168 hours. BNP (last 3 results) No results for input(s): PROBNP in the last 8760 hours. HbA1C: No results for input(s): HGBA1C in the last 72 hours. CBG: No results for input(s): GLUCAP in the last 168 hours. Lipid Profile: No results for input(s): CHOL, HDL, LDLCALC, TRIG, CHOLHDL, LDLDIRECT in the last 72 hours. Thyroid Function Tests: No results for input(s): TSH, T4TOTAL, FREET4, T3FREE, THYROIDAB in the last 72 hours. Anemia Panel: No results for input(s): VITAMINB12, FOLATE, FERRITIN, TIBC, IRON, RETICCTPCT in the last 72 hours. Sepsis Labs: No results for input(s): PROCALCITON, LATICACIDVEN in the last 168 hours.  No results found for this or any previous visit (from the past 240 hour(s)).       Radiology Studies: No results found.      Scheduled Meds: . atorvastatin  40 mg Oral QHS  . bisacodyl  10 mg Rectal Daily  . Chlorhexidine Gluconate Cloth  6 each Topical Daily  . clopidogrel  75 mg Oral Daily  . docusate sodium  100 mg Oral BID  . doxepin  10 mg Oral QHS  . polyethylene glycol  17 g Oral Daily  . QUEtiapine  25 mg Oral QHS   Continuous Infusions:    LOS: 11 days    Time spent: 30 mins    Delfino Lovett, MD [facility] Pager [phone]  If 7PM-7AM, please contact  night-coverage 03/18/2021, 1:15 PM

## 2021-03-18 NOTE — Progress Notes (Addendum)
  Subjective:  POD #6 s/p left IM fixation for IT hip fracture.   Patient's nurse and daughter at the bedside today.  Patient is awake and laying comfortable in bed.  Her daughter helps to translate for her.  Patient speaks [place].  Objective:   VITALS:   Vitals:   03/17/21 1638 03/18/21 0449 03/18/21 0830 03/18/21 1556  BP: 132/61 136/63 (!) 142/48 (!) 142/87  Pulse: (!) 105 91 99 (!) 104  Resp: 16 20 18 18   Temp: (!) 97.5 F (36.4 C) 98.2 F (36.8 C) 98.3 F (36.8 C) 98.6 F (37 C)  TempSrc:      SpO2: 98% 100% 100% 96%  Weight:      Height:        PHYSICAL EXAM: Left lower extremity: Intact pulses distally Dorsiflexion/Plantar flexion intact Incision: moderate drainage No cellulitis present Compartment soft  LABS  No results found for this or any previous visit (from the past 24 hour(s)).  No results found.  Assessment/Plan: 6 Days Post-Op   Principal Problem:   left Hip fracture (HCC) Active Problems:   Anxiety and depression   Coronary artery disease involving native coronary artery of native heart without angina pectoris   Essential hypertension   Other insomnia   Pure hypercholesterolemia   Valvular heart disease   Acute lower UTI   Pressure injury of skin  Patient has been denied SNF by her insurance.  The daughter is appealing this decision.  Patient is weightbearing as tolerated left lower extremity.  Her dressing will be changed as it has moderate serosanguineous drainage.  Patient stable postop from an orthopedic standpoint.  Continue physical therapy as tolerated.  Patient would benefit from a skilled nursing facility stay upon discharge given her risk for falling at home.  Patient on plavix.    , MD 03/18/2021, 5:54 PM

## 2021-03-19 DIAGNOSIS — S72002A Fracture of unspecified part of neck of left femur, initial encounter for closed fracture: Secondary | ICD-10-CM | POA: Diagnosis not present

## 2021-03-19 DIAGNOSIS — N390 Urinary tract infection, site not specified: Secondary | ICD-10-CM | POA: Diagnosis not present

## 2021-03-19 DIAGNOSIS — I2510 Atherosclerotic heart disease of native coronary artery without angina pectoris: Secondary | ICD-10-CM | POA: Diagnosis not present

## 2021-03-19 DIAGNOSIS — F419 Anxiety disorder, unspecified: Secondary | ICD-10-CM | POA: Diagnosis not present

## 2021-03-19 LAB — CBC
HCT: 25.2 % — ABNORMAL LOW (ref 36.0–46.0)
Hemoglobin: 8.1 g/dL — ABNORMAL LOW (ref 12.0–15.0)
MCH: 31.5 pg (ref 26.0–34.0)
MCHC: 32.1 g/dL (ref 30.0–36.0)
MCV: 98.1 fL (ref 80.0–100.0)
Platelets: 241 10*3/uL (ref 150–400)
RBC: 2.57 MIL/uL — ABNORMAL LOW (ref 3.87–5.11)
RDW: 14.8 % (ref 11.5–15.5)
WBC: 8.8 10*3/uL (ref 4.0–10.5)
nRBC: 0 % (ref 0.0–0.2)

## 2021-03-19 LAB — BASIC METABOLIC PANEL
Anion gap: 7 (ref 5–15)
BUN: 32 mg/dL — ABNORMAL HIGH (ref 8–23)
CO2: 26 mmol/L (ref 22–32)
Calcium: 8.1 mg/dL — ABNORMAL LOW (ref 8.9–10.3)
Chloride: 107 mmol/L (ref 98–111)
Creatinine, Ser: 0.93 mg/dL (ref 0.44–1.00)
GFR, Estimated: 55 mL/min — ABNORMAL LOW (ref >60)
Glucose, Bld: 92 mg/dL (ref 70–99)
Potassium: 3.8 mmol/L (ref 3.5–5.1)
Sodium: 140 mmol/L (ref 135–145)

## 2021-03-19 NOTE — TOC Progression Note (Signed)
Transition of Care [facility]) - Progression Note    Patient Details  Name: Sara Conley MRN: [id number] Date of Birth: 1918-07-27  Transition [facility] Care [facility]) CM/SW Contact  Barrie Dunker, RN Phone Number: 03/19/2021, 10:21 AM  Clinical Narrative:    TOC called to check the status of the plan appeal, Spoke with Judeth Cornfield, They are not showing an appeal on the patient but state that it may not have came thru yet        Expected Discharge Plan and Services                                                 Social Determinants of Health (SDOH) Interventions    Readmission Risk Interventions No flowsheet data found.

## 2021-03-19 NOTE — Progress Notes (Signed)
PROGRESS NOTE    Sara Conley  WGN:[id number] DOB: [birth year] DOA: 03/07/2021 PCP: Marisue Ivan, MD    HPI was taken from Dr. Mikeal Hawthorne:  Sara Conley is a 87 y.o. female with medical history significant of hypertension, hyperlipidemia, dementia, who only speaks [place] brought in by her daughter after sustaining a mechanical fall at home and having pain on her left side.  She tripped low while using her walker in the restroom.  She fell on her left side.  There was severe pain in the area and she called out to her daughter.  Patient was seen in the ER and has left intertrochanteric fracture.  Orthopedics consulted but patient is on Plavix.  Recommendation is for admission and getting patient ready for surgery probably in about 3 days.  She did not hit her head.  Patient is frail but has been apparently doing well in her usual state of health.  Able to do her ADLs according to the daughter.  She was using the walker but can move around the house.  She otherwise has no other complaints.  No other areas of injury..  ED Course: Temperature is 98.6 initial blood pressure 207/66 with pulse 59 respirate of 16 oxygen sat 87% on room air.  Currently 100%.  White count is 11.1 hemoglobin 10.7 platelets of 189.  Chemistry mostly within normal except BUN 35 calcium 8.7.  Glucose of 115.  Viral screen including COVID-19 is negative urinalysis showed WBC 16 with rare bacteria and positive nitrite.  Head CT without contrast showed no acute findings.  Chest x-ray showed no significant findings.  X-ray of the left hip showed comminuted intertrochanteric left hip fracture.  Patient being admitted to the hospital for further evaluation and treatment.   Hospital course from Dr. Mayford Knife 5/5-5/10/22: Pt presented w/ left hip fracture secondary to fall at home. Pt is s/p left intramedullary nail on 03/12/21 as per ortho surg. PT/OT should see the pt today. Pt's daughter is hopeful that the pt will go to SNF after  d/c. Also, pt has UTI and is on IV rocephin   Hospital course from Dr. Sherryll Burger 5/11-5/16/2022: Waiting for insurance appeal.  It was originally declined for which I did peer to peer twice and still declined.  Now family has appealed.   5/11-PT, OT recommended SNF.  Waiting for insurance Auth 5/12 -insurance declined.  Patient was very sleepy likely from narcotic and benzos. 5/13 -waiting on insurance appeal.  PT and OT reeval still recommending SNF.  Patient awake and alert today 5/14 -insurance declined SNF.  Family appealed 5/15 -offered to consider hospice at home but family wants to wait for insurance appeal which is pending 5/16 -family appeal to insurance still pending.  If it gets declined again they would consider hospice at home  Assessment & Plan:   Principal Problem:   left Hip fracture (HCC) Active Problems:   Anxiety and depression   Coronary artery disease involving native coronary artery of native heart without angina pectoris   Essential hypertension   Other insomnia   Pure hypercholesterolemia   Valvular heart disease   Acute lower UTI   Pressure injury of skin   Left intertrochanteric fracture: s/p mechanical fall.  Continue plavix. S/p intramedullary nail on 03/12/21.  Family is requesting no narcotic meds if possible  HTN: Blood pressure soft will hold metoprolol.  As needed clonidine  Hyperkalemia: likely secondary to ARB use. Lokelma x 1.  Normalized  HLD: continue statin   E. Coli UTI:  completed course of Abx. Urine cx is growing e. coli   Hx of CAD: continue Plavix and statin. Continue to hold ARB secondary to hyperkalemia  Dementia: continue w/ supportive care. Continue on seroquel qhs and doxepin  Thrombocytopenia: resolved   Macrocytic anemia: folate, B12 are WNL. H&H are stable   Leukocytosis: Resolved   DVT prophylaxis: lovenox  Code Status: full  Family Communication: discussed pt's care w/ pt's daughter at bedside and answered her questions  on 5/16 Disposition Plan: SNF  Level of care: Med-Surg   Status is: Inpatient  Remains inpatient appropriate because:Ongoing diagnostic testing needed not appropriate for outpatient work up, Unsafe d/c plan, IV treatments appropriate due to intensity of illness or inability to take PO and Inpatient level of care appropriate due to severity of illness   Dispo: The patient is from: Home              Anticipated d/c is to: SNF versus home with hospice              Patient currently is not medically stable to d/c.   Difficult to place patient: Patient's family has appealed with her insurance declining SNF.  Appeal status is pending    Consultants:   Ortho surg  Procedures:  Hip surgery  Antimicrobials: None  Subjective: No new complaints.  Patient is awake and alert.  Daughter at bedside.  Objective: Vitals:   03/18/21 2005 03/19/21 0430 03/19/21 0738 03/19/21 1138  BP: (!) 149/66 (!) 133/59 (!) 151/61 (!) 130/57  Pulse: 99 94 89 92  Resp: 18 16 17 16   Temp: 98.6 F (37 C) 98 F (36.7 C) 98.4 F (36.9 C) 98.4 F (36.9 C)  TempSrc:   Oral Oral  SpO2: 95% 95% 97% 96%  Weight:      Height:        Intake/Output Summary (Last 24 hours) at 03/19/2021 1353 Last data filed at 03/19/2021 1030 Gross per 24 hour  Intake 240 ml  Output 0 ml  Net 240 ml   Filed Weights   03/07/21 1717  Weight: 42.6 kg    Examination:  General exam: Frail appearing. Appears comfortable  Respiratory system: clear breath sounds b/l. No rales  Cardiovascular system: S1/S2+. No gallops or rubs  Gastrointestinal system: Abd is soft, NT, ND & normal bowel sounds  Central nervous system:  nonfocal exam.  Sleepy status post Ativan last night Psychiatry: Sleepy    Data Reviewed: I have personally reviewed following labs and imaging studies  CBC: Recent Labs  Lab 03/12/21 2229 03/13/21 0620 03/14/21 0539 03/15/21 0543 03/19/21 0548  WBC  --  11.2* 10.2 8.0 8.8  HGB 8.7* 8.5* 7.2*  7.7* 8.1*  HCT 27.4* 26.8* 21.7* 23.6* 25.2*  MCV  --  99.3 96.9 97.5 98.1  PLT  --  158 153 158 241   Basic Metabolic Panel: Recent Labs  Lab 03/13/21 0620 03/13/21 1617 03/14/21 0539 03/15/21 0543 03/19/21 0548  NA 136  --  135 135 140  K 5.4* 4.7 4.5 4.2 3.8  CL 99  --  101 102 107  CO2 24  --  26 25 26   GLUCOSE 128*  --  107* 100* 92  BUN 49*  --  67* 63* 32*  CREATININE 1.51*  --  1.75* 1.48* 0.93  CALCIUM 8.4*  --  8.0* 8.1* 8.1*   GFR: Estimated Creatinine Clearance: 20.8 mL/min (by C-G formula based on SCr of 0.93 mg/dL). Liver Function Tests: No results  for input(s): AST, ALT, ALKPHOS, BILITOT, PROT, ALBUMIN in the last 168 hours. No results for input(s): LIPASE, AMYLASE in the last 168 hours. No results for input(s): AMMONIA in the last 168 hours. Coagulation Profile: No results for input(s): INR, PROTIME in the last 168 hours. Cardiac Enzymes: No results for input(s): CKTOTAL, CKMB, CKMBINDEX, TROPONINI in the last 168 hours. BNP (last 3 results) No results for input(s): PROBNP in the last 8760 hours. HbA1C: No results for input(s): HGBA1C in the last 72 hours. CBG: No results for input(s): GLUCAP in the last 168 hours. Lipid Profile: No results for input(s): CHOL, HDL, LDLCALC, TRIG, CHOLHDL, LDLDIRECT in the last 72 hours. Thyroid Function Tests: No results for input(s): TSH, T4TOTAL, FREET4, T3FREE, THYROIDAB in the last 72 hours. Anemia Panel: No results for input(s): VITAMINB12, FOLATE, FERRITIN, TIBC, IRON, RETICCTPCT in the last 72 hours. Sepsis Labs: No results for input(s): PROCALCITON, LATICACIDVEN in the last 168 hours.  No results found for this or any previous visit (from the past 240 hour(s)).       Radiology Studies: No results found.      Scheduled Meds: . atorvastatin  40 mg Oral QHS  . bisacodyl  10 mg Rectal Daily  . Chlorhexidine Gluconate Cloth  6 each Topical Daily  . clopidogrel  75 mg Oral Daily  . docusate sodium   100 mg Oral BID  . doxepin  10 mg Oral QHS  . polyethylene glycol  17 g Oral Daily  . QUEtiapine  25 mg Oral QHS   Continuous Infusions:    LOS: 12 days    Time spent: 30 mins    Delfino Lovett, MD [facility] Pager [phone]  If 7PM-7AM, please contact night-coverage 03/19/2021, 1:53 PM

## 2021-03-19 NOTE — Plan of Care (Signed)

## 2021-03-19 NOTE — Progress Notes (Signed)
Physical Therapy Treatment Patient Details Name: Denette Hass MRN: [id number] DOB: Dec 08, 1917 Today's Date: 03/19/2021    History of Present Illness Pt is a 87 y.o. female presenting to hospital 5/4 s/p mechanical fall (tripped while using walker).  Imaging showing comminuted intertrochanteric L hip fx.  Likely UTI.  Pt s/p L IMN femoral for L hip fx 03/12/21.  PMH includes mild dementia, htn, HLD, coronary angioplasty with stent placement.    PT Comments    Daughter in to translate and assist.  OOB with min a x 1 and increased time.  Walked 5' to commode with mod a x 1 and significant post lean.  Small void.  Then walked and additional 6' x 1, 20' x 2 and 30' x 1.  Pt with less post lean and improved step pattern with time.  Very motivated to walk despite fatigue and some pain.  She was taking good steps by end of session with less scissoring and wider BOS.  Remained in recliner after session.  SNF remains appropriate for discharge.  She is very motivated and showing good progress towards goals.   Follow Up Recommendations  SNF     Equipment Recommendations  Rolling walker with 5" wheels;3in1 (PT);Wheelchair (measurements PT);Wheelchair cushion (measurements PT);Hospital bed    Recommendations for Other Services       Precautions / Restrictions Precautions Precautions: Fall Restrictions Weight Bearing Restrictions: Yes LLE Weight Bearing: Weight bearing as tolerated    Mobility  Bed Mobility Overal bed mobility: Needs Assistance Bed Mobility: Supine to Sit     Supine to sit: Min assist          Transfers Overall transfer level: Needs assistance Equipment used: Rolling walker (2 wheeled) Transfers: Sit to/from Stand Sit to Stand: Min assist         General transfer comment: cues for hand placements  Ambulation/Gait Ambulation/Gait assistance: Min assist;Mod assist Gait Distance (Feet): 30 Feet Assistive device: Rolling walker (2 wheeled) Gait  Pattern/deviations: Step-to pattern;Step-through pattern;Narrow base of support;Leaning posteriorly Gait velocity: decreased   General Gait Details: 5, 6, 20 x 2 and 30' x 1 progressively improving gait during session   Stairs             Wheelchair Mobility    Modified Rankin (Stroke Patients Only)       Balance Overall balance assessment: Needs assistance Sitting-balance support: Feet supported;Bilateral upper extremity supported Sitting balance-Leahy Scale: Poor     Standing balance support: Bilateral upper extremity supported Standing balance-Leahy Scale: Poor Standing balance comment: posterior push noted                            Cognition Arousal/Alertness: Awake/alert Behavior During Therapy: WFL for tasks assessed/performed Overall Cognitive Status: Difficult to assess                                        Exercises      General Comments        Pertinent Vitals/Pain Pain Assessment: Faces Faces Pain Scale: Hurts a little bit Pain Location: L hip/thigh Pain Descriptors / Indicators: Guarding;Operative site guarding;Grimacing Pain Intervention(s): Limited activity within patient's tolerance;Monitored during session;Repositioned    Home Living                      Prior Function  PT Goals (current goals can now be found in the care plan section) Progress towards PT goals: Progressing toward goals    Frequency    7X/week      PT Plan Current plan remains appropriate    Co-evaluation              AM-PAC PT "6 Clicks" Mobility   Outcome Measure  Help needed turning from your back to your side while in a flat bed without using bedrails?: A Little Help needed moving from lying on your back to sitting on the side of a flat bed without using bedrails?: A Lot Help needed moving to and from a bed to a chair (including a wheelchair)?: A Lot Help needed standing up from a chair using your  arms (e.g., wheelchair or bedside chair)?: A Lot Help needed to walk in hospital room?: A Lot Help needed climbing 3-5 steps with a railing? : Total 6 Click Score: 12    End of Session Equipment Utilized During Treatment: Gait belt Activity Tolerance: Patient tolerated treatment well Patient left: in bed;with call bell/phone within reach;with bed alarm set;with family/visitor present;with SCD's reapplied Nurse Communication: Mobility status;Precautions;Weight bearing status PT Visit Diagnosis: Other abnormalities of gait and mobility (R26.89);Muscle weakness (generalized) (M62.81);History of falling (Z91.81);Difficulty in walking, not elsewhere classified (R26.2);Pain Pain - Right/Left: Left Pain - part of body: Hip     Time: 6283-1517 PT Time Calculation (min) (ACUTE ONLY): 39 min  Charges:  $Gait Training: 23-37 mins $Therapeutic Activity: 8-22 mins                    Danielle Dess, PTA 03/19/21, 11:30 AM

## 2021-03-19 NOTE — Progress Notes (Signed)
Occupational Therapy Treatment Patient Details Name: Sara Conley MRN: [id number] DOB: 07-09-1918 Today's Date: 03/19/2021    History of present illness Pt is a 87 y.o. female presenting to hospital 5/4 s/p mechanical fall (tripped while using walker).  Imaging showing comminuted intertrochanteric L hip fx.  Likely UTI.  Pt s/p L IMN femoral for L hip fx 03/12/21.  PMH includes mild dementia, htn, HLD, coronary angioplasty with stent placement.   OT comments  Daughter present throughout session and provided translation. When I arrived in room, daughter was feeding pt, but Sara Conley indicated she would like to be able to handle her own utensils. She required greatly increased time, taking small bites, but was able to feed self, with minimal spillage. Pt was smiling and pleasant, and daughter stated that pt seems to be feeling much better. Sara Conley required Max A for standing, ambulation, toileting. Pt was tired during/after ambulation, but did not appear to be in excessive pain. Recommend DC to SNF so that pt can continue rehab from fx, prior to returning to her daughter's home. Whether pt is at [facility] or home, recommend hospice referral, to improve pt's comfort, functional mobility, and to decrease caregiver burden.   Follow Up Recommendations  SNF    Equipment Recommendations  Tub/shower seat;3 in 1 bedside commode    Recommendations for Other Services Other (comment) (hospice)    Precautions / Restrictions Precautions Precautions: Fall Restrictions Weight Bearing Restrictions: Yes LLE Weight Bearing: Weight bearing as tolerated       Mobility Bed Mobility Overal bed mobility: Needs Assistance Bed Mobility: Supine to Sit     Supine to sit: Min assist     General bed mobility comments: Pt received in recliner    Transfers Overall transfer level: Needs assistance Equipment used: Rolling walker (2 wheeled) Transfers: Sit to/from [employer] Sit to Stand:  Mod assist Stand pivot transfers: Max assist       General transfer comment: Tactile cues for hand, foot placement with RW    Balance Overall balance assessment: Needs assistance Sitting-balance support: Feet supported;Bilateral upper extremity supported Sitting balance-Leahy Scale: Fair     Standing balance support: Bilateral upper extremity supported Standing balance-Leahy Scale: Poor Standing balance comment: has difficulty with hand/foot placement with RW                           ADL either performed or assessed with clinical judgement   ADL Overall ADL's : Needs assistance/impaired Eating/Feeding: Set up;Minimal assistance;Sitting Eating/Feeding Details (indicate cue type and reason): Min A and extra time required                     Toilet Transfer: BSC;Stand-pivot;Maximal assistance;RW   Toileting- Clothing Manipulation and Hygiene: Sitting/lateral lean;Maximal assistance               Vision Patient Visual Report: No change from baseline     Perception     Praxis      Cognition Arousal/Alertness: Awake/alert Behavior During Therapy: WFL for tasks assessed/performed Overall Cognitive Status: History of cognitive impairments - at baseline                     Current Attention Level: Sustained   Following Commands: Follows one step commands with increased time     Problem Solving: Slow processing;Difficulty sequencing;Requires verbal cues;Requires tactile cues          Exercises Other Exercises Other  Exercises: self-feeding, mobility, transfers, ambulation with RW, toileting, discussion re: DC plans w/ daughter   Shoulder Instructions       General Comments      Pertinent Vitals/ Pain       Pain Assessment: Faces Faces Pain Scale: Hurts a little bit Pain Location: L hip/thigh Pain Descriptors / Indicators: Guarding;Operative site guarding;Grimacing Pain Intervention(s): Limited activity within patient's  tolerance;Monitored during session;Repositioned  Home Living                                          Prior Functioning/Environment              Frequency  Min 2X/week        Progress Toward Goals  OT Goals(current goals can now be found in the care plan section)  Progress towards OT goals: Progressing toward goals  Acute Rehab OT Goals Patient Stated Goal: to get her back to PLOF OT Goal Formulation: With family Time For Goal Achievement: 03/28/21 Potential to Achieve Goals: Fair  Plan Discharge plan remains appropriate;Frequency needs to be updated    Co-evaluation                 AM-PAC OT "6 Clicks" Daily Activity     Outcome Measure   Help from another person eating meals?: A Little Help from another person taking care of personal grooming?: A Little Help from another person toileting, which includes using toliet, bedpan, or urinal?: A Lot Help from another person bathing (including washing, rinsing, drying)?: A Lot Help from another person to put on and taking off regular upper body clothing?: A Lot Help from another person to put on and taking off regular lower body clothing?: Total 6 Click Score: 13    End of Session Equipment Utilized During Treatment: Rolling walker  OT Visit Diagnosis: Unsteadiness on feet (R26.81);Muscle weakness (generalized) (M62.81);Pain Pain - Right/Left: Left Pain - part of body: Hip   Activity Tolerance Patient tolerated treatment well   Patient Left in chair;with family/visitor present;with call bell/phone within reach;with chair alarm set   Nurse Communication          Time: 3646-8032 OT Time Calculation (min): 25 min  Charges: OT General Charges $OT Visit: 1 Visit OT Treatments $Self Care/Home Management : 23-37 mins  Latina Craver, PhD, MS, OTR/L 03/19/21, 2:01 PM

## 2021-03-19 NOTE — Plan of Care (Signed)
  Problem: Education: Goal: Knowledge of General Education information will improve Description: Including pain rating scale, medication(s)/side effects and non-pharmacologic comfort measures Outcome: Not Progressing   Problem: Health Behavior/Discharge Planning: Goal: Ability to manage health-related needs will improve Outcome: Not Progressing   Problem: Clinical Measurements: Goal: Ability to maintain clinical measurements within normal limits will improve Outcome: Not Progressing Goal: Will remain free from infection Outcome: Not Progressing Goal: Diagnostic test results will improve Outcome: Not Progressing Goal: Respiratory complications will improve Outcome: Not Progressing Goal: Cardiovascular complication will be avoided Outcome: Not Progressing   Problem: Activity: Goal: Risk for activity intolerance will decrease Outcome: Not Progressing   Problem: Nutrition: Goal: Adequate nutrition will be maintained Outcome: Not Progressing   Problem: Coping: Goal: Level of anxiety will decrease Outcome: Not Progressing   Problem: Elimination: Goal: Will not experience complications related to bowel motility Outcome: Not Progressing Goal: Will not experience complications related to urinary retention Outcome: Not Progressing   Problem: Pain Managment: Goal: General experience of comfort will improve Outcome: Not Progressing   Problem: Safety: Goal: Ability to remain free from injury will improve Outcome: Not Progressing   Problem: Skin Integrity: Goal: Risk for impaired skin integrity will decrease Outcome: Not Progressing   Problem: Education: Goal: Verbalization of understanding the information provided (i.e., activity precautions, restrictions, etc) will improve Outcome: Not Progressing Goal: Individualized Educational Video(s) Outcome: Not Progressing   Problem: Activity: Goal: Ability to ambulate and perform ADLs will improve Outcome: Not Progressing    Problem: Clinical Measurements: Goal: Postoperative complications will be avoided or minimized Outcome: Not Progressing   Problem: Self-Concept: Goal: Ability to maintain and perform role responsibilities to the fullest extent possible will improve Outcome: Not Progressing   Problem: Pain Management: Goal: Pain level will decrease Outcome: Not Progressing   

## 2021-03-19 NOTE — Progress Notes (Addendum)
[facility] hospital liaison note: Referral for [facility] hospice services at home received over the weekend for [employer].  Writer met in the room with patient's daughter Bartolo Darter to discuss hospice services. Florence's prefers for her mother to be able to go to rehab and then return home. She has spoken with the insurance company herself and is awaiting the result of the appeal. Patient was alert and did work with PT during writers visit. Patient is nonenglish speaking and does much better when her daughter is present to interpret. Patient information has been sent to referral. Hospice eligibility has been confirmed. Patient will need DME in place in the home prior to discharge IF she does not go to SNF. Update given to [place]. Will await outcome of appeal and family decision.  Flo Shanks BSN, RN, [place] [phone]

## 2021-03-20 DIAGNOSIS — S72002A Fracture of unspecified part of neck of left femur, initial encounter for closed fracture: Secondary | ICD-10-CM | POA: Diagnosis not present

## 2021-03-20 NOTE — Progress Notes (Signed)
Occupational Therapy Treatment Patient Details Name: Sara Conley MRN: [id number] DOB: 1918-10-16 Today's Date: 03/20/2021    History of present illness Pt is a 87 y.o. female presenting to hospital 5/4 s/p mechanical fall (tripped while using walker).  Imaging showing comminuted intertrochanteric L hip fx.  Likely UTI.  Pt s/p L IMN femoral for L hip fx 03/12/21.  PMH includes mild dementia, htn, HLD, coronary angioplasty with stent placement.   OT comments  Sara Conley participated eagerly in OT this AM, ambulating to bathroom with Mod/Max A, managing pericare with SUPV, then standing at the sink for ~ 10 minutes, during which she washed her face, hands, upper body, and teeth with gusto, without needing any guidance or cueing. Per suggestion from PT, switched RW from adult to pediatric walker, which enabled Sara Conley to bear more weight through her UE with ambulation and to stand up taller. Pt able to feed self with set up and occasional assistance; she also participated minimally in LB dressing task.  Per pt's daughter, who provided translation, pt continues to feel better today, has less pain than in previous week, and appears more alert. Continue to recommend STR to enable pt to return to her PLOF, as well as a hospice consultation.    Follow Up Recommendations  SNF    Equipment Recommendations       Recommendations for Other Services Other (comment) (hospice consult)    Precautions / Restrictions Precautions Precautions: Fall Restrictions Weight Bearing Restrictions: Yes LLE Weight Bearing: Weight bearing as tolerated       Mobility Bed Mobility Overal bed mobility: Needs Assistance             General bed mobility comments: Pt OOB throughout session    Transfers Overall transfer level: Needs assistance Equipment used: Rolling walker (2 wheeled) Transfers: Sit to/from [employer] Sit to Stand: Mod assist Stand pivot transfers: Mod assist        General transfer comment: Switched from regular to pediatric walker (per recommendation from PT), pt able to weight bear and stand taller with the new walker    Balance Overall balance assessment: Needs assistance Sitting-balance support: Feet supported;Bilateral upper extremity supported Sitting balance-Leahy Scale: Fair     Standing balance support: Bilateral upper extremity supported Standing balance-Leahy Scale: Poor Standing balance comment: has difficulty with hand/foot placement with RW, improved with switch to pediatric walker                           ADL either performed or assessed with clinical judgement   ADL Overall ADL's : Needs assistance/impaired Eating/Feeding: Set up;Minimal assistance;Sitting   Grooming: Wash/dry hands;Wash/dry face;Oral care;Standing;Moderate assistance Grooming Details (indicate cue type and reason): standing at sink for ~ 10 minutes to perform grooming routine             Lower Body Dressing: Moderate assistance Lower Body Dressing Details (indicate cue type and reason): donning socks; pt able to lift leg and to pull up sock after therapist has threated it onto the foot Toilet Transfer: Stand-pivot;Moderate assistance;RW;Comfort height toilet   Toileting- Clothing Manipulation and Hygiene: Sitting/lateral lean;Supervision/safety;Set up Toileting - Clothing Manipulation Details (indicate cue type and reason): Pt able to perform anterior peri care in sitting with SUPV             Vision Patient Visual Report: No change from baseline     Perception     Praxis  Cognition Arousal/Alertness: Awake/alert Behavior During Therapy: WFL for tasks assessed/performed Overall Cognitive Status: History of cognitive impairments - at baseline                               Problem Solving: Slow processing;Difficulty sequencing;Requires verbal cues;Requires tactile cues          Exercises Other  Exercises Other Exercises: self-feeding, mobility, transfers, ambulation with RW, toileting, grooming, discussion re: DC plans w/ daughter   Shoulder Instructions       General Comments      Pertinent Vitals/ Pain       Faces Pain Scale: Hurts a little bit Pain Location: L hip/thigh Pain Descriptors / Indicators: Guarding;Operative site guarding;Grimacing Pain Intervention(s): Repositioned  Home Living                                          Prior Functioning/Environment              Frequency  Min 2X/week        Progress Toward Goals  OT Goals(current goals can now be found in the care plan section)  Progress towards OT goals: Progressing toward goals  Acute Rehab OT Goals Patient Stated Goal: to get her back to PLOF OT Goal Formulation: With family Time For Goal Achievement: 03/28/21 Potential to Achieve Goals: Good  Plan Discharge plan remains appropriate;Frequency remains appropriate    Co-evaluation                 AM-PAC OT "6 Clicks" Daily Activity     Outcome Measure   Help from another person eating meals?: A Little Help from another person taking care of personal grooming?: A Little Help from another person toileting, which includes using toliet, bedpan, or urinal?: A Lot Help from another person bathing (including washing, rinsing, drying)?: A Lot Help from another person to put on and taking off regular upper body clothing?: A Lot Help from another person to put on and taking off regular lower body clothing?: A Lot 6 Click Score: 14    End of Session Equipment Utilized During Treatment: Rolling walker  OT Visit Diagnosis: Unsteadiness on feet (R26.81);Muscle weakness (generalized) (M62.81);Pain Pain - Right/Left: Left Pain - part of body: Hip   Activity Tolerance Patient tolerated treatment well   Patient Left in chair;with family/visitor present;with call bell/phone within reach;with chair alarm set   Nurse  Communication          Time: 8250-0370 OT Time Calculation (min): 25 min  Charges: OT General Charges $OT Visit: 1 Visit OT Treatments $Self Care/Home Management : 23-37 mins  Latina Craver, PhD, MS, OTR/L 03/20/21, 10:43 AM

## 2021-03-20 NOTE — Progress Notes (Signed)
[facility] Room 149 [facility]) Hospital Liaison RN note:  Spoke with daughter, Cristy Friedlander, over the phone to provide update. Per discussion, plan is for patient to still go to [facility] for rehab pending insurance appeal.  [facility] Liaison will continue to follow for disposition.  Please call with any hospice related questions or concerns.  Cyndra Numbers, RN [facility] Liaison [phone]

## 2021-03-20 NOTE — Progress Notes (Signed)
Physical Therapy Treatment Patient Details Name: Sara Conley MRN: [id number] DOB: [birth year] Today's Date: 03/20/2021    History of Present Illness Pt is a 87 y.o. female presenting to hospital 5/4 s/p mechanical fall (tripped while using walker).  Imaging showing comminuted intertrochanteric L hip fx.  Likely UTI.  Pt s/p L IMN femoral for L hip fx 03/12/21.  PMH includes mild dementia, htn, HLD, coronary angioplasty with stent placement.    PT Comments    Pt was sitting in recliner with supportive daughter at bedside upon arriving. She agrees to PT session requesting to go to BR. Stood to [employer] and ambulated to BR. While pt was in BR, Chartered loss adjuster issued pediatric RW. She demonstrated improved abilities with shorter walker but overall is progressing well. She will greatly benefit from SNF at DC to continue to progress strength, balance, and safe functional mobility while assisting pt to PLOF. Pt was independently ambulating and performing ADLs prior to admission. At conclusion of session, pt was working with OT.    Follow Up Recommendations  SNF     Equipment Recommendations  Rolling walker with 5" wheels;3in1 (PT);Wheelchair (measurements PT);Wheelchair cushion (measurements PT);Hospital bed;Other (comment) (Equipment needs if not able to go to rehab)       Precautions / Restrictions Precautions Precautions: Fall Restrictions Weight Bearing Restrictions: Yes LLE Weight Bearing: Weight bearing as tolerated    Mobility  Bed Mobility Overal bed mobility: Needs Assistance    General bed mobility comments: Pt was in recliner upon arriving. per daughter, pt got OOB to recliner last nigth without assistance.    Transfers Overall transfer level: Needs assistance Equipment used: Rolling walker (2 wheeled) Transfers: Sit to/from Stand Sit to Stand: Min assist Stand pivot transfers: Mod assist       General transfer comment: Min assist to STS from recliner. need tactle and vcs for  proper technique  Ambulation/Gait Ambulation/Gait assistance: Min assist Gait Distance (Feet): 25 Feet Assistive device: Rolling walker (2 wheeled) Gait Pattern/deviations: Step-to pattern;Step-through pattern;Narrow base of support;Leaning posteriorly Gait velocity: decreased   General Gait Details: Pt was able to ambulate to BR with RW + min assist. antalgic step to pattern. Vcs for posture correction throughout      Balance Overall balance assessment: Needs assistance Sitting-balance support: Feet supported;Bilateral upper extremity supported Sitting balance-Leahy Scale: Fair     Standing balance support: Bilateral upper extremity supported Standing balance-Leahy Scale: Fair Standing balance comment: reliant on BUE support for safety       Cognition Arousal/Alertness: Awake/alert Behavior During Therapy: WFL for tasks assessed/performed Overall Cognitive Status: History of cognitive impairments - at baseline      Problem Solving: Slow processing;Difficulty sequencing;Requires verbal cues;Requires tactile cues General Comments: Pt is alert but difficult to assess cognition status due to langage barriers      Exercises Other Exercises Other Exercises: self-feeding, mobility, transfers, ambulation with RW, toileting, grooming, discussion re: DC plans w/ daughter        Pertinent Vitals/Pain Faces Pain Scale: Hurts a little bit Pain Location: L hip/thigh Pain Descriptors / Indicators: Guarding;Operative site guarding;Grimacing Pain Intervention(s): Repositioned           PT Goals (current goals can now be found in the care plan section) Acute Rehab PT Goals Patient Stated Goal: none stated Progress towards PT goals: Progressing toward goals    Frequency    7X/week      PT Plan Current plan remains appropriate       AM-PAC PT "6 Clicks"  Mobility   Outcome Measure  Help needed turning from your back to your side while in a flat bed without using  bedrails?: A Little Help needed moving from lying on your back to sitting on the side of a flat bed without using bedrails?: A Little Help needed moving to and from a bed to a chair (including a wheelchair)?: A Little Help needed standing up from a chair using your arms (e.g., wheelchair or bedside chair)?: A Little Help needed to walk in hospital room?: A Little Help needed climbing 3-5 steps with a railing? : A Little 6 Click Score: 18    End of Session Equipment Utilized During Treatment: Gait belt Activity Tolerance: Patient tolerated treatment well Patient left: Other (comment);with family/visitor present (pt wa sin BR with OT/Pt's daughter at conclusion of session) Nurse Communication: Mobility status;Precautions;Weight bearing status PT Visit Diagnosis: Other abnormalities of gait and mobility (R26.89);Muscle weakness (generalized) (M62.81);History of falling (Z91.81);Difficulty in walking, not elsewhere classified (R26.2);Pain Pain - Right/Left: Left Pain - part of body: Hip     Time: 0850-0910 PT Time Calculation (min) (ACUTE ONLY): 20 min  Charges:  $Therapeutic Activity: 8-22 mins                     Jetta Lout PTA 03/20/21, 12:13 PM

## 2021-03-20 NOTE — Progress Notes (Signed)
PROGRESS NOTE    [name]  OEV:[id number] DOB: 15-Jan-1918 DOA: 03/07/2021 PCP: Marisue Ivan, MD    HPI was taken from Dr. Mikeal Hawthorne:  Sara Conley is a 87 y.o. female with medical history significant of hypertension, hyperlipidemia, dementia, who only speaks [place] brought in by her daughter after sustaining a mechanical fall at home and having pain on her left side.  She tripped low while using her walker in the restroom.  She fell on her left side.  There was severe pain in the area and she called out to her daughter.  Patient was seen in the ER and has left intertrochanteric fracture.  Orthopedics consulted but patient is on Plavix.  Recommendation is for admission and getting patient ready for surgery probably in about 3 days.  She did not hit her head.  Patient is frail but has been apparently doing well in her usual state of health.  Able to do her ADLs according to the daughter.  She was using the walker but can move around the house.  She otherwise has no other complaints.  No other areas of injury..  ED Course: Temperature is 98.6 initial blood pressure 207/66 with pulse 59 respirate of 16 oxygen sat 87% on room air.  Currently 100%.  White count is 11.1 hemoglobin 10.7 platelets of 189.  Chemistry mostly within normal except BUN 35 calcium 8.7.  Glucose of 115.  Viral screen including COVID-19 is negative urinalysis showed WBC 16 with rare bacteria and positive nitrite.  Head CT without contrast showed no acute findings.  Chest x-ray showed no significant findings.  X-ray of the left hip showed comminuted intertrochanteric left hip fracture.  Patient being admitted to the hospital for further evaluation and treatment.   Hospital course from Dr. Mayford Knife 5/5-5/10/22: Pt presented w/ left hip fracture secondary to fall at home. Pt is s/p left intramedullary nail on 03/12/21 as per ortho surg. PT/OT should see the pt today. Pt's daughter is hopeful that the pt will go to SNF after  d/c. Also, pt has UTI and is on IV rocephin   Hospital course from Dr. Sherryll Burger 5/11-5/16/2022: Waiting for insurance appeal.  It was originally declined for which I did peer to peer twice and still declined.  Now family has appealed.   5/11-PT, OT recommended SNF.  Waiting for insurance Auth 5/12 -insurance declined.  Patient was very sleepy likely from narcotic and benzos. 5/13 -waiting on insurance appeal.  PT and OT reeval still recommending SNF.  Patient awake and alert today 5/14 -insurance declined SNF.  Family appealed 5/15 -offered to consider hospice at home but family wants to wait for insurance appeal which is pending 5/16 -family appeal to insurance still pending.  If it gets declined again they would consider hospice at home 5/17: Family appealed results are still pending.  Assessment & Plan:   Principal Problem:   left Hip fracture (HCC) Active Problems:   Anxiety and depression   Coronary artery disease involving native coronary artery of native heart without angina pectoris   Essential hypertension   Other insomnia   Pure hypercholesterolemia   Valvular heart disease   Acute lower UTI   Pressure injury of skin   Left intertrochanteric fracture: s/p mechanical fall.  Continue plavix. S/p intramedullary nail on 03/12/21.  Family is requesting no narcotic meds if possible  HTN: Blood pressure soft will hold metoprolol.  As needed clonidine  Hyperkalemia: likely secondary to ARB use. Lokelma x 1.  Normalized  HLD:  continue statin   E. Coli UTI: completed course of Abx. Urine cx is growing e. coli   Hx of CAD: continue Plavix and statin. Continue to hold ARB secondary to hyperkalemia  Dementia: continue w/ supportive care. Continue on seroquel qhs and doxepin  Thrombocytopenia: resolved   Macrocytic anemia: folate, B12 are WNL. H&H are stable   Leukocytosis: Resolved   DVT prophylaxis: lovenox  Code Status: DNR Family Communication: Discussed with daughter at  bedside Disposition Plan: SNF  Level of care: Med-Surg   Status is: Inpatient  Remains inpatient appropriate because:Ongoing diagnostic testing needed not appropriate for outpatient work up, Unsafe d/c plan, IV treatments appropriate due to intensity of illness or inability to take PO and Inpatient level of care appropriate due to severity of illness   Dispo: The patient is from: Home              Anticipated d/c is to: SNF versus home with hospice              Patient currently is not medically stable to d/c.   Difficult to place patient: Patient's family has appealed with her insurance declining SNF.  Appeal status is pending   Consultants:   Ortho surg  Procedures:  Hip surgery  Antimicrobials: None  Subjective: Patient was seen and examined today.  No new complaint.  Sitting comfortably in chair.  Daughter at bedside.  Objective: Vitals:   03/19/21 1138 03/19/21 1603 03/20/21 0930 03/20/21 1540  BP: (!) 130/57 129/64 (!) 111/51 (!) 183/62  Pulse: 92 (!) 108 95 88  Resp: 16 16 18 18   Temp: 98.4 F (36.9 C) 98 F (36.7 C) (!) 97.5 F (36.4 C) 98.2 F (36.8 C)  TempSrc: Oral Oral Oral Oral  SpO2: 96% 97% 98% 95%  Weight:      Height:        Intake/Output Summary (Last 24 hours) at 03/20/2021 1628 Last data filed at 03/20/2021 1350 Gross per 24 hour  Intake 240 ml  Output --  Net 240 ml   Filed Weights   03/07/21 1717  Weight: 42.6 kg    Examination:  General.  Frail elderly lady, in no acute distress. Pulmonary.  Lungs clear bilaterally, normal respiratory effort. CV.  Regular rate and rhythm, no JVD, rub or murmur. Abdomen.  Soft, nontender, nondistended, BS positive. CNS.  Alert and oriented x3.  No focal neurologic deficit. Extremities.  No edema, no cyanosis, pulses intact and symmetrical. Psychiatry.  Judgment and insight appears normal.   Data Reviewed: I have personally reviewed following labs and imaging studies  CBC: Recent Labs  Lab  03/14/21 0539 03/15/21 0543 03/19/21 0548  WBC 10.2 8.0 8.8  HGB 7.2* 7.7* 8.1*  HCT 21.7* 23.6* 25.2*  MCV 96.9 97.5 98.1  PLT 153 158 241   Basic Metabolic Panel: Recent Labs  Lab 03/14/21 0539 03/15/21 0543 03/19/21 0548  NA 135 135 140  K 4.5 4.2 3.8  CL 101 102 107  CO2 26 25 26   GLUCOSE 107* 100* 92  BUN 67* 63* 32*  CREATININE 1.75* 1.48* 0.93  CALCIUM 8.0* 8.1* 8.1*   GFR: Estimated Creatinine Clearance: 20.8 mL/min (by C-G formula based on SCr of 0.93 mg/dL). Liver Function Tests: No results for input(s): AST, ALT, ALKPHOS, BILITOT, PROT, ALBUMIN in the last 168 hours. No results for input(s): LIPASE, AMYLASE in the last 168 hours. No results for input(s): AMMONIA in the last 168 hours. Coagulation Profile: No results for input(s): INR,  PROTIME in the last 168 hours. Cardiac Enzymes: No results for input(s): CKTOTAL, CKMB, CKMBINDEX, TROPONINI in the last 168 hours. BNP (last 3 results) No results for input(s): PROBNP in the last 8760 hours. HbA1C: No results for input(s): HGBA1C in the last 72 hours. CBG: No results for input(s): GLUCAP in the last 168 hours. Lipid Profile: No results for input(s): CHOL, HDL, LDLCALC, TRIG, CHOLHDL, LDLDIRECT in the last 72 hours. Thyroid Function Tests: No results for input(s): TSH, T4TOTAL, FREET4, T3FREE, THYROIDAB in the last 72 hours. Anemia Panel: No results for input(s): VITAMINB12, FOLATE, FERRITIN, TIBC, IRON, RETICCTPCT in the last 72 hours. Sepsis Labs: No results for input(s): PROCALCITON, LATICACIDVEN in the last 168 hours.  No results found for this or any previous visit (from the past 240 hour(s)).    Radiology Studies: No results found.  Scheduled Meds: . atorvastatin  40 mg Oral QHS  . bisacodyl  10 mg Rectal Daily  . Chlorhexidine Gluconate Cloth  6 each Topical Daily  . clopidogrel  75 mg Oral Daily  . docusate sodium  100 mg Oral BID  . doxepin  10 mg Oral QHS  . polyethylene glycol  17 g  Oral Daily  . QUEtiapine  25 mg Oral QHS   Continuous Infusions:    LOS: 13 days    Time spent: 20 mins   Arnetha Courser, MD [facility] Pager [phone]  If 7PM-7AM, please contact night-coverage 03/20/2021, 4:28 PM

## 2021-03-21 DIAGNOSIS — S72002A Fracture of unspecified part of neck of left femur, initial encounter for closed fracture: Secondary | ICD-10-CM | POA: Diagnosis not present

## 2021-03-21 LAB — CREATININE, SERUM
Creatinine, Ser: 0.81 mg/dL (ref 0.44–1.00)
GFR, Estimated: >60 mL/min (ref >60)

## 2021-03-21 NOTE — Care Management Important Message (Signed)
Important Message  Patient Details  Name: Sara Conley MRN: [id number] Date of Birth: 19-Aug-1918   Medicare Important Message Given:  Yes  Patient was sleeping and daughter, Sara Conley in the room with her.  I reviewed the form once again with her and she understands the contents. She is still awaiting determination of authorization for skilled nursing. She is hopeful to get an answer today. I thanked her for time.  Sara Conley 03/21/2021, 10:31 AM

## 2021-03-21 NOTE — Progress Notes (Signed)
PROGRESS NOTE    Sara Conley  PIR:[id number] DOB: 25-Jan-1918 DOA: 03/07/2021 PCP: Marisue Ivan, MD    HPI was taken from Dr. Mikeal Hawthorne:  Sara Conley is a 87 y.o. female with medical history significant of hypertension, hyperlipidemia, dementia, who only speaks [place] brought in by her daughter after sustaining a mechanical fall at home and having pain on her left side.  She tripped low while using her walker in the restroom.  She fell on her left side.  There was severe pain in the area and she called out to her daughter.  Patient was seen in the ER and has left intertrochanteric fracture.  Orthopedics consulted but patient is on Plavix.  Recommendation is for admission and getting patient ready for surgery probably in about 3 days.  She did not hit her head.  Patient is frail but has been apparently doing well in her usual state of health.  Able to do her ADLs according to the daughter.  She was using the walker but can move around the house.  She otherwise has no other complaints.  No other areas of injury..  ED Course: Temperature is 98.6 initial blood pressure 207/66 with pulse 59 respirate of 16 oxygen sat 87% on room air.  Currently 100%.  White count is 11.1 hemoglobin 10.7 platelets of 189.  Chemistry mostly within normal except BUN 35 calcium 8.7.  Glucose of 115.  Viral screen including COVID-19 is negative urinalysis showed WBC 16 with rare bacteria and positive nitrite.  Head CT without contrast showed no acute findings.  Chest x-ray showed no significant findings.  X-ray of the left hip showed comminuted intertrochanteric left hip fracture.  Patient being admitted to the hospital for further evaluation and treatment.   Hospital course from Dr. Mayford Knife 5/5-5/10/22: Pt presented w/ left hip fracture secondary to fall at home. Pt is s/p left intramedullary nail on 03/12/21 as per ortho surg. PT/OT should see the pt today. Pt's daughter is hopeful that the pt will go to SNF after  d/c. Also, pt has UTI and is on IV rocephin   Hospital course from Dr. Sherryll Burger 5/11-5/16/2022: Waiting for insurance appeal.  It was originally declined for which I did peer to peer twice and still declined.  Now family has appealed.   5/11-PT, OT recommended SNF.  Waiting for insurance Auth 5/12 -insurance declined.  Patient was very sleepy likely from narcotic and benzos. 5/13 -waiting on insurance appeal.  PT and OT reeval still recommending SNF.  Patient awake and alert today 5/14 -insurance declined SNF.  Family appealed 5/15 -offered to consider hospice at home but family wants to wait for insurance appeal which is pending 5/16 -family appeal to insurance still pending.  If it gets declined again they would consider hospice at home 5/17: Family appealed results are still pending. 5/18; apparently decision went up to second-level that will take another 2 days for a definitive answer.  Assessment & Plan:   Principal Problem:   left Hip fracture (HCC) Active Problems:   Anxiety and depression   Coronary artery disease involving native coronary artery of native heart without angina pectoris   Essential hypertension   Other insomnia   Pure hypercholesterolemia   Valvular heart disease   Acute lower UTI   Pressure injury of skin   Left intertrochanteric fracture: s/p mechanical fall.  Continue plavix. S/p intramedullary nail on 03/12/21.  Family is requesting no narcotic meds if possible  HTN: Blood pressure soft will hold metoprolol.  As needed clonidine  Hyperkalemia: likely secondary to ARB use. Lokelma x 1.  Normalized  HLD: continue statin   E. Coli UTI: completed course of Abx. Urine cx is growing e. coli   Hx of CAD: continue Plavix and statin. Continue to hold ARB secondary to hyperkalemia  Dementia: continue w/ supportive care. Continue on seroquel qhs and doxepin  Thrombocytopenia: resolved   Macrocytic anemia: folate, B12 are WNL. H&H are stable   Leukocytosis:  Resolved   DVT prophylaxis: lovenox  Code Status: DNR Family Communication: Discussed with daughter at bedside Disposition Plan: SNF  Level of care: Med-Surg   Status is: Inpatient  Remains inpatient appropriate because:Ongoing diagnostic testing needed not appropriate for outpatient work up, Unsafe d/c plan, IV treatments appropriate due to intensity of illness or inability to take PO and Inpatient level of care appropriate due to severity of illness  Currently waiting from insurance decision for rehab.   Dispo: The patient is from: Home              Anticipated d/c is to: SNF versus home with hospice              Patient currently is not medically stable to d/c.   Difficult to place patient: Patient's family has appealed with her insurance declining SNF.  Appeal status is pending, will take another couple of days.   Consultants:   Ortho surg  Procedures:  Hip surgery  Antimicrobials: None  Subjective: Patient has no new complaint today.  Resting comfortably in bed.  Daughter at bedside.  Objective: Vitals:   03/21/21 0759 03/21/21 1132 03/21/21 1133 03/21/21 1556  BP: (!) 142/70 (!) 136/59 (!) 136/59 (!) 149/53  Pulse: 88 82 82 85  Resp: 16 16 16 16   Temp: 97.8 F (36.6 C) 97.8 F (36.6 C) 97.8 F (36.6 C) 97.7 F (36.5 C)  TempSrc:  Oral Oral Oral  SpO2: 98%  97% 100%  Weight:      Height:        Intake/Output Summary (Last 24 hours) at 03/21/2021 1655 Last data filed at 03/21/2021 1403 Gross per 24 hour  Intake 240 ml  Output --  Net 240 ml   Filed Weights   03/07/21 1717  Weight: 42.6 kg    Examination:  General.  Frail elderly lady, in no acute distress. Pulmonary.  Lungs clear bilaterally, normal respiratory effort. CV.  Regular rate and rhythm, no JVD, rub or murmur. Abdomen.  Soft, nontender, nondistended, BS positive. CNS.  Alert and oriented x3.  No focal neurologic deficit. Extremities.  No edema, no cyanosis, pulses intact and  symmetrical. Psychiatry.  Judgment and insight appears normal.   Data Reviewed: I have personally reviewed following labs and imaging studies  CBC: Recent Labs  Lab 03/15/21 0543 03/19/21 0548  WBC 8.0 8.8  HGB 7.7* 8.1*  HCT 23.6* 25.2*  MCV 97.5 98.1  PLT 158 241   Basic Metabolic Panel: Recent Labs  Lab 03/15/21 0543 03/19/21 0548 03/21/21 0613  NA 135 140  --   K 4.2 3.8  --   CL 102 107  --   CO2 25 26  --   GLUCOSE 100* 92  --   BUN 63* 32*  --   CREATININE 1.48* 0.93 0.81  CALCIUM 8.1* 8.1*  --    GFR: Estimated Creatinine Clearance: 23.8 mL/min (by C-G formula based on SCr of 0.81 mg/dL). Liver Function Tests: No results for input(s): AST, ALT, ALKPHOS, BILITOT, PROT, ALBUMIN  in the last 168 hours. No results for input(s): LIPASE, AMYLASE in the last 168 hours. No results for input(s): AMMONIA in the last 168 hours. Coagulation Profile: No results for input(s): INR, PROTIME in the last 168 hours. Cardiac Enzymes: No results for input(s): CKTOTAL, CKMB, CKMBINDEX, TROPONINI in the last 168 hours. BNP (last 3 results) No results for input(s): PROBNP in the last 8760 hours. HbA1C: No results for input(s): HGBA1C in the last 72 hours. CBG: No results for input(s): GLUCAP in the last 168 hours. Lipid Profile: No results for input(s): CHOL, HDL, LDLCALC, TRIG, CHOLHDL, LDLDIRECT in the last 72 hours. Thyroid Function Tests: No results for input(s): TSH, T4TOTAL, FREET4, T3FREE, THYROIDAB in the last 72 hours. Anemia Panel: No results for input(s): VITAMINB12, FOLATE, FERRITIN, TIBC, IRON, RETICCTPCT in the last 72 hours. Sepsis Labs: No results for input(s): PROCALCITON, LATICACIDVEN in the last 168 hours.  No results found for this or any previous visit (from the past 240 hour(s)).    Radiology Studies: No results found.  Scheduled Meds: . atorvastatin  40 mg Oral QHS  . bisacodyl  10 mg Rectal Daily  . clopidogrel  75 mg Oral Daily  . docusate  sodium  100 mg Oral BID  . doxepin  10 mg Oral QHS  . polyethylene glycol  17 g Oral Daily  . QUEtiapine  25 mg Oral QHS   Continuous Infusions:    LOS: 14 days    Time spent: 20 mins   Arnetha Courser, MD [facility] Pager [phone]  If 7PM-7AM, please contact night-coverage 03/21/2021, 4:55 PM

## 2021-03-21 NOTE — TOC Progression Note (Signed)
Transition of Care [facility]) - Progression Note    Patient Details  Name: Sara Conley MRN: [id number] Date of Birth: 23-Jan-1918  Transition of Care [facility]) CM/SW Contact  Barrie Dunker, RN Phone Number: 03/21/2021, 11:13 AM  Clinical Narrative:    TOC called [employer] to follow up on the status of the appeal that the member's daughter has requested, Spoke to [employer] that had no record of the appeal, Called the health plan and spoke to [place], he stated that he has until tomorrow at 918 to make a decision, he feels that they will have a decision today, I notified [facility] of the appeal still in process.  Awaiting to hear back from them with the decision        Expected Discharge Plan and Services                                                 Social Determinants of Health (SDOH) Interventions    Readmission Risk Interventions No flowsheet data found.

## 2021-03-21 NOTE — TOC Progression Note (Signed)
Transition of Care [facility]) - Progression Note    Patient Details  Name: Sara Conley MRN: [id number] Date of Birth: 11/02/1918  Transition of Care [facility]) CM/SW Contact  Barrie Dunker, RN Phone Number: 03/21/2021, 11:22 AM  Clinical Narrative:    Received a call from Crystal at [employer], she stated that the decision was to uphold the denial, the appeal has went to [employer] to 2nd level and they have 2 days to make a decision, awaiting call with decision        Expected Discharge Plan and Services                                                 Social Determinants of Health (SDOH) Interventions    Readmission Risk Interventions No flowsheet data found.

## 2021-03-21 NOTE — Progress Notes (Signed)
Physical Therapy Treatment Patient Details Name: Sara Conley MRN: [id number] DOB: Oct 23, 1918 Today's Date: 03/21/2021    History of Present Illness Pt is a 87 y.o. female presenting to hospital 5/4 s/p mechanical fall (tripped while using walker).  Imaging showing comminuted intertrochanteric L hip fx.  Likely UTI.  Pt s/p L IMN femoral for L hip fx 03/12/21.  PMH includes mild dementia, htn, HLD, coronary angioplasty with stent placement.    PT Comments    Pt was sitting on [facility] with RN tech present. Pt was alert but difficult to assess cognition due to language barriers. She did however demonstrate much improved gait abilities and safety overall. Tolerated standing and ambulating 35 ft with RW around room. Pt was able to follow simple commands consistently throughout. Once sitting in recliner, issued HEP( in english) to have pt use pictures to demonstrate desired exercise. She was abe to perform and tolerated well. Acute PT will continue to follow and progress as able per POC. Recommend SNF to address deficits while assisting pt to PLOF.   Follow Up Recommendations  SNF     Equipment Recommendations  Rolling walker with 5" wheels;3in1 (PT);Wheelchair (measurements PT);Wheelchair cushion (measurements PT);Hospital bed;Other (comment)       Precautions / Restrictions Precautions Precautions: Fall Restrictions Weight Bearing Restrictions: Yes LLE Weight Bearing: Weight bearing as tolerated    Mobility  Bed Mobility    General bed mobility comments: pt was on BSC upon arrival and in recliner post session    Transfers Overall transfer level: Needs assistance Equipment used: Rolling walker (2 wheeled) Transfers: Sit to/from Stand Sit to Stand: Min assist         General transfer comment: min assist to stand from [facility] and from recliner surface  Ambulation/Gait Ambulation/Gait assistance: Min assist;Min guard Gait Distance (Feet): 35 Feet Assistive device: Rolling walker (2  wheeled) Gait Pattern/deviations: Step-through pattern;Trunk flexed Gait velocity: decreased   General Gait Details: pt with much improved gait kinematics and tolerance today. Min assist to at first progressing to CGA.       Balance Overall balance assessment: Needs assistance Sitting-balance support: Feet supported;Bilateral upper extremity supported Sitting balance-Leahy Scale: Fair     Standing balance support: Bilateral upper extremity supported Standing balance-Leahy Scale: Fair Standing balance comment: reliant on BUE support for safety       Cognition Arousal/Alertness: Awake/alert Behavior During Therapy: WFL for tasks assessed/performed Overall Cognitive Status: History of cognitive impairments - at baseline         General Comments General comments (skin integrity, edema, etc.): Issued HEP handout ( for picture use only) and pt tolerated well.      Pertinent Vitals/Pain Pain Assessment: No/denies pain Pain Score: 0-No pain           PT Goals (current goals can now be found in the care plan section) Acute Rehab PT Goals Patient Stated Goal: none stated Progress towards PT goals: Progressing toward goals    Frequency    7X/week      PT Plan Current plan remains appropriate       AM-PAC PT "6 Clicks" Mobility   Outcome Measure  Help needed turning from your back to your side while in a flat bed without using bedrails?: A Little Help needed moving from lying on your back to sitting on the side of a flat bed without using bedrails?: A Little Help needed moving to and from a bed to a chair (including a wheelchair)?: A Little Help needed standing up  from a chair using your arms (e.g., wheelchair or bedside chair)?: A Little Help needed to walk in hospital room?: A Little Help needed climbing 3-5 steps with a railing? : A Little 6 Click Score: 18    End of Session Equipment Utilized During Treatment: Gait belt Activity Tolerance: Patient tolerated  treatment well   Nurse Communication: Mobility status;Precautions;Weight bearing status PT Visit Diagnosis: Other abnormalities of gait and mobility (R26.89);Muscle weakness (generalized) (M62.81);History of falling (Z91.81);Difficulty in walking, not elsewhere classified (R26.2);Pain Pain - Right/Left: Left Pain - part of body: Hip     Time: 6283-6629 PT Time Calculation (min) (ACUTE ONLY): 28 min  Charges:  $Gait Training: 8-22 mins $Therapeutic Exercise: 8-22 mins                     Jetta Lout PTA 03/21/21, 4:02 PM

## 2021-03-22 DIAGNOSIS — S72002A Fracture of unspecified part of neck of left femur, initial encounter for closed fracture: Secondary | ICD-10-CM | POA: Diagnosis not present

## 2021-03-22 NOTE — TOC Progression Note (Signed)
Transition of Care [facility]) - Progression Note    Patient Details  Name: Sara Conley MRN: [id number] Date of Birth: August 31, 1918  Transition of Care [facility]) CM/SW Contact  Barrie Dunker, RN Phone Number: 03/22/2021, 9:54 AM  Clinical Narrative:    Still awaiting approval from [employer] from 2nd level appeal, Plan to go to [facility] at DC if approved        Expected Discharge Plan and Services                                                 Social Determinants of Health (SDOH) Interventions    Readmission Risk Interventions No flowsheet data found.

## 2021-03-22 NOTE — Progress Notes (Signed)
Physical Therapy Treatment Patient Details Name: Sara Conley MRN: [id number] DOB: November 02, 1918 Today's Date: 03/22/2021    History of Present Illness Pt is a 87 y.o. female presenting to hospital 5/4 s/p mechanical fall (tripped while using walker).  Imaging showing comminuted intertrochanteric L hip fx.  Likely UTI.  Pt s/p L IMN femoral for L hip fx 03/12/21.  PMH includes mild dementia, htn, HLD, coronary angioplasty with stent placement.    PT Comments    Pt was sitting in recliner with daughter at bedside upon arriving. She is alert watching [employer] church service. Pt is agreeable to ambulation and PT session. Pt did demonstrate increased pain but is unable to rate due to language/cognition barriers. Per daughter who speaks [place] states she is at her baseline cognition. Pt was consistently able to follow desired task requested and is highly motivated to improve functional abilities. She stood to RW with min assist. Ambulate 50 ft with flexed antalgic gait pattern but overall tolerated well. Returned to [employer] after gait training. Highly recommend DC to SNF however has been denied by insurance. If unable to go to rehab at DC she will greatly benefit from having WC, hospital bed, and HH services to decrease caregiver burden and maximize independence with ADLs.     Follow Up Recommendations  SNF     Equipment Recommendations  Rolling walker with 5" wheels;3in1 (PT);Wheelchair (measurements PT);Wheelchair cushion (measurements PT);Hospital bed;Other (comment)       Precautions / Restrictions Precautions Precautions: Fall Restrictions Weight Bearing Restrictions: Yes LLE Weight Bearing: Weight bearing as tolerated    Mobility  Bed Mobility  General bed mobility comments: in recliner pre/post session    Transfers Overall transfer level: Needs assistance Equipment used: Rolling walker (2 wheeled) Transfers: Sit to/from Stand Sit to Stand: Min assist         General  transfer comment: min assist to STS form recliner 3 x throughout session  Ambulation/Gait Ambulation/Gait assistance: Min assist Gait Distance (Feet): 50 Feet Assistive device: Rolling walker (2 wheeled) Gait Pattern/deviations: Trunk flexed;Step-to pattern Gait velocity: decreased   General Gait Details: Pt was able to ambulate 50 ft with RW with antalgic step to pattern. poor gait posture however pt seemed to tolerate well      Balance Overall balance assessment: Needs assistance Sitting-balance support: Feet supported;Bilateral upper extremity supported       Standing balance support: Bilateral upper extremity supported Standing balance-Leahy Scale: Fair       Cognition Arousal/Alertness: Awake/alert Behavior During Therapy: WFL for tasks assessed/performed Overall Cognitive Status: History of cognitive impairments - at baseline    Following Commands: Follows one step commands consistently;Follows one step commands with increased time       General Comments: difficult to assess cognition due to langage barriers. Per daughter, " she is back to baseline."             Pertinent Vitals/Pain Pain Assessment: Faces Faces Pain Scale: Hurts even more Pain Location: L hip/thigh Pain Descriptors / Indicators: Guarding;Operative site guarding;Grimacing Pain Intervention(s): Limited activity within patient's tolerance;Monitored during session;Premedicated before session;Repositioned;Patient requesting pain meds-RN notified (tylonol)           PT Goals (current goals can now be found in the care plan section) Acute Rehab PT Goals Patient Stated Goal: none stated Progress towards PT goals: Progressing toward goals    Frequency    7X/week      PT Plan Current plan remains appropriate       AM-PAC PT "  6 Clicks" Mobility   Outcome Measure  Help needed turning from your back to your side while in a flat bed without using bedrails?: A Little Help needed moving  from lying on your back to sitting on the side of a flat bed without using bedrails?: A Little Help needed moving to and from a bed to a chair (including a wheelchair)?: A Little Help needed standing up from a chair using your arms (e.g., wheelchair or bedside chair)?: A Little Help needed to walk in hospital room?: A Little Help needed climbing 3-5 steps with a railing? : A Little 6 Click Score: 18    End of Session Equipment Utilized During Treatment: Gait belt Activity Tolerance: Patient tolerated treatment well Patient left: in chair;with call bell/phone within reach;with chair alarm set;with family/visitor present Nurse Communication: Mobility status;Other (comment) (RN aware of request of tylonol) PT Visit Diagnosis: Other abnormalities of gait and mobility (R26.89);Muscle weakness (generalized) (M62.81);History of falling (Z91.81);Difficulty in walking, not elsewhere classified (R26.2);Pain Pain - Right/Left: Left Pain - part of body: Hip     Time: 0912-0926 PT Time Calculation (min) (ACUTE ONLY): 14 min  Charges:  $Gait Training: 8-22 mins                     Jetta Lout PTA 03/22/21, 9:43 AM

## 2021-03-22 NOTE — Progress Notes (Signed)
PROGRESS NOTE    Sara Conley  FGH:[id number] DOB: 07/02/1918 DOA: 03/07/2021 PCP: Marisue Ivan, MD    HPI was taken from Dr. Mikeal Hawthorne:  Sara Conley is a 87 y.o. female with medical history significant of hypertension, hyperlipidemia, dementia, who only speaks [place] brought in by her daughter after sustaining a mechanical fall at home and having pain on her left side.  She tripped low while using her walker in the restroom.  She fell on her left side.  There was severe pain in the area and she called out to her daughter.  Patient was seen in the ER and has left intertrochanteric fracture.  Orthopedics consulted but patient is on Plavix.  Recommendation is for admission and getting patient ready for surgery probably in about 3 days.  She did not hit her head.  Patient is frail but has been apparently doing well in her usual state of health.  Able to do her ADLs according to the daughter.  She was using the walker but can move around the house.  She otherwise has no other complaints.  No other areas of injury..  ED Course: Temperature is 98.6 initial blood pressure 207/66 with pulse 59 respirate of 16 oxygen sat 87% on room air.  Currently 100%.  White count is 11.1 hemoglobin 10.7 platelets of 189.  Chemistry mostly within normal except BUN 35 calcium 8.7.  Glucose of 115.  Viral screen including COVID-19 is negative urinalysis showed WBC 16 with rare bacteria and positive nitrite.  Head CT without contrast showed no acute findings.  Chest x-ray showed no significant findings.  X-ray of the left hip showed comminuted intertrochanteric left hip fracture.  Patient being admitted to the hospital for further evaluation and treatment.   Hospital course from Dr. Mayford Knife 5/5-5/10/22: Pt presented w/ left hip fracture secondary to fall at home. Pt is s/p left intramedullary nail on 03/12/21 as per ortho surg. PT/OT should see the pt today. Pt's daughter is hopeful that the pt will go to SNF after  d/c. Also, pt has UTI and is on IV rocephin   Hospital course from Dr. Sherryll Burger 5/11-5/16/2022: Waiting for insurance appeal.  It was originally declined for which I did peer to peer twice and still declined.  Now family has appealed.   5/11-PT, OT recommended SNF.  Waiting for insurance Auth 5/12 -insurance declined.  Patient was very sleepy likely from narcotic and benzos. 5/13 -waiting on insurance appeal.  PT and OT reeval still recommending SNF.  Patient awake and alert today 5/14 -insurance declined SNF.  Family appealed 5/15 -offered to consider hospice at home but family wants to wait for insurance appeal which is pending 5/16 -family appeal to insurance still pending.  If it gets declined again they would consider hospice at home 5/17: Family appealed results are still pending. 5/18; apparently decision went up to second-level that will take another 2 days for a definitive answer. 5/19: Still waiting for insurance decision.  Assessment & Plan:   Principal Problem:   left Hip fracture (HCC) Active Problems:   Anxiety and depression   Coronary artery disease involving native coronary artery of native heart without angina pectoris   Essential hypertension   Other insomnia   Pure hypercholesterolemia   Valvular heart disease   Acute lower UTI   Pressure injury of skin   Left intertrochanteric fracture: s/p mechanical fall.  Continue plavix. S/p intramedullary nail on 03/12/21.  Family is requesting no narcotic meds if possible  HTN: Blood  pressure soft will hold metoprolol.  As needed clonidine  Hyperkalemia: likely secondary to ARB use. Lokelma x 1.  Normalized  HLD: continue statin   E. Coli UTI: completed course of Abx. Urine cx is growing e. coli   Hx of CAD: continue Plavix and statin. Continue to hold ARB secondary to hyperkalemia  Dementia: continue w/ supportive care. Continue on seroquel qhs and doxepin  Thrombocytopenia: resolved   Macrocytic anemia: folate, B12  are WNL. H&H are stable   Leukocytosis: Resolved   DVT prophylaxis: lovenox  Code Status: DNR Family Communication: Discussed with daughter at bedside Disposition Plan: SNF  Level of care: Med-Surg   Status is: Inpatient  Remains inpatient appropriate because:Ongoing diagnostic testing needed not appropriate for outpatient work up, Unsafe d/c plan, IV treatments appropriate due to intensity of illness or inability to take PO and Inpatient level of care appropriate due to severity of illness  Currently waiting from insurance decision for rehab.   Dispo: The patient is from: Home              Anticipated d/c is to: SNF versus home with hospice              Patient currently is not medically stable to d/c.   Difficult to place patient: Patient's family has appealed with her insurance declining SNF.  Appeal status is pending, will take another couple of days.   Consultants:   Ortho surg  Procedures:  Hip surgery  Antimicrobials: None  Subjective: Patient was seen and examined today.  Sitting comfortably in chair.  Eating and drinking okay.  Still waiting for insurance decision for SNF.  Objective: Vitals:   03/21/21 2253 03/22/21 0530 03/22/21 1318 03/22/21 1625  BP: (!) 144/66 (!) 153/62 (!) 112/40 (!) 137/51  Pulse: 90 81 97 90  Resp: 18 18 16 16   Temp: 98.1 F (36.7 C) (!) 97.5 F (36.4 C) 97.9 F (36.6 C) 98.2 F (36.8 C)  TempSrc: Oral Oral    SpO2: 97% 96% (!) 87% 97%  Weight:      Height:        Intake/Output Summary (Last 24 hours) at 03/22/2021 1656 Last data filed at 03/22/2021 1410 Gross per 24 hour  Intake 600 ml  Output 0 ml  Net 600 ml   Filed Weights   03/07/21 1717  Weight: 42.6 kg    Examination:  General.  Frail elderly lady, in no acute distress. Pulmonary.  Lungs clear bilaterally, normal respiratory effort. CV.  Regular rate and rhythm, no JVD, rub or murmur. Abdomen.  Soft, nontender, nondistended, BS positive. CNS.  Alert and  oriented.  No focal neurologic deficit. Extremities.  No edema, no cyanosis, pulses intact and symmetrical. Psychiatry.  Judgment and insight appears normal.  Data Reviewed: I have personally reviewed following labs and imaging studies  CBC: Recent Labs  Lab 03/19/21 0548  WBC 8.8  HGB 8.1*  HCT 25.2*  MCV 98.1  PLT 241   Basic Metabolic Panel: Recent Labs  Lab 03/19/21 0548 03/21/21 0613  NA 140  --   K 3.8  --   CL 107  --   CO2 26  --   GLUCOSE 92  --   BUN 32*  --   CREATININE 0.93 0.81  CALCIUM 8.1*  --    GFR: Estimated Creatinine Clearance: 23.8 mL/min (by C-G formula based on SCr of 0.81 mg/dL). Liver Function Tests: No results for input(s): AST, ALT, ALKPHOS, BILITOT, PROT, ALBUMIN in  the last 168 hours. No results for input(s): LIPASE, AMYLASE in the last 168 hours. No results for input(s): AMMONIA in the last 168 hours. Coagulation Profile: No results for input(s): INR, PROTIME in the last 168 hours. Cardiac Enzymes: No results for input(s): CKTOTAL, CKMB, CKMBINDEX, TROPONINI in the last 168 hours. BNP (last 3 results) No results for input(s): PROBNP in the last 8760 hours. HbA1C: No results for input(s): HGBA1C in the last 72 hours. CBG: No results for input(s): GLUCAP in the last 168 hours. Lipid Profile: No results for input(s): CHOL, HDL, LDLCALC, TRIG, CHOLHDL, LDLDIRECT in the last 72 hours. Thyroid Function Tests: No results for input(s): TSH, T4TOTAL, FREET4, T3FREE, THYROIDAB in the last 72 hours. Anemia Panel: No results for input(s): VITAMINB12, FOLATE, FERRITIN, TIBC, IRON, RETICCTPCT in the last 72 hours. Sepsis Labs: No results for input(s): PROCALCITON, LATICACIDVEN in the last 168 hours.  No results found for this or any previous visit (from the past 240 hour(s)).    Radiology Studies: No results found.  Scheduled Meds: . atorvastatin  40 mg Oral QHS  . bisacodyl  10 mg Rectal Daily  . clopidogrel  75 mg Oral Daily  .  docusate sodium  100 mg Oral BID  . doxepin  10 mg Oral QHS  . polyethylene glycol  17 g Oral Daily  . QUEtiapine  25 mg Oral QHS   Continuous Infusions:    LOS: 15 days    Time spent: 20 mins   Arnetha Courser, MD [facility] Pager [phone]  If 7PM-7AM, please contact night-coverage 03/22/2021, 4:56 PM

## 2021-03-23 DIAGNOSIS — S72002A Fracture of unspecified part of neck of left femur, initial encounter for closed fracture: Secondary | ICD-10-CM | POA: Diagnosis not present

## 2021-03-23 NOTE — TOC Progression Note (Signed)
Transition of Care [facility]) - Progression Note    Patient Details  Name: Sara Conley MRN: [id number] Date of Birth: 04-25-1918  Transition of Care [facility]) CM/SW Contact  Barrie Dunker, RN Phone Number: 03/23/2021, 11:19 AM  Clinical Narrative:     Called [facility] and spoke with Crystal, I inquired about the status of the 2nd level appeal, she stated that she will call the health plan if she has not gotten a call by lunch time she will call and check and let me know the results       Expected Discharge Plan and Services                                                 Social Determinants of Health (SDOH) Interventions    Readmission Risk Interventions No flowsheet data found.

## 2021-03-23 NOTE — Progress Notes (Signed)
Physical Therapy Treatment Patient Details Name: Sara Conley MRN: [id number] DOB: 1918-05-19 Today's Date: 03/23/2021    History of Present Illness Pt is a 87 y.o. female presenting to hospital 5/4 s/p mechanical fall (tripped while using walker).  Imaging showing comminuted intertrochanteric L hip fx.  Likely UTI.  Pt s/p L IMN femoral for L hip fx 03/12/21.  PMH includes mild dementia, htn, HLD, coronary angioplasty with stent placement.    PT Comments    Therapist was contacted by pt's family stating pt was ready for PT session. She was in BR upon entry and agreeable to session. Ambulated ~ 70 ft with RW + slow antalgic step to pattern. Does have some unsteadiness noted and does fatigue extremely quickly. Highly recommend DC to SNF to address deficits while maximizing independence with ADLs.    Follow Up Recommendations  SNF     Equipment Recommendations  Rolling walker with 5" wheels;3in1 (PT);Wheelchair (measurements PT);Wheelchair cushion (measurements PT);Hospital bed;Other (comment) (If DCing home)       Precautions / Restrictions Precautions Precautions: Fall Restrictions Weight Bearing Restrictions: Yes LLE Weight Bearing: Weight bearing as tolerated    Mobility  Bed Mobility Overal bed mobility: Needs Assistance Bed Mobility: Sit to Supine       Sit to supine: Mod assist   General bed mobility comments: mod assist to sit to supine form EOB shorts it. Vcs for technique + mod assist for achieve    Transfers Overall transfer level: Needs assistance Equipment used: Rolling walker (2 wheeled) Transfers: Sit to/from Stand Sit to Stand: Min assist         General transfer comment: Min assist to stand from BSC/EOB  Ambulation/Gait Ambulation/Gait assistance: Min assist Gait Distance (Feet): 70 Feet Assistive device: Rolling walker (2 wheeled) Gait Pattern/deviations: Trunk flexed;Step-to pattern;Narrow base of support;Antalgic Gait velocity: decreased    General Gait Details: pt was able to ambulate increased distance with RW       Balance Overall balance assessment: Needs assistance Sitting-balance support: Feet supported;Bilateral upper extremity supported Sitting balance-Leahy Scale: Fair     Standing balance support: Bilateral upper extremity supported Standing balance-Leahy Scale: Fair Standing balance comment: reliant on BUE support    Cognition Arousal/Alertness: Awake/alert Behavior During Therapy: WFL for tasks assessed/performed Overall Cognitive Status: History of cognitive impairments - at baseline                Pertinent Vitals/Pain Pain Assessment: Faces Faces Pain Scale: Hurts a little bit Pain Location: L hip/thigh Pain Descriptors / Indicators: Guarding;Operative site guarding;Grimacing Pain Intervention(s): Limited activity within patient's tolerance;Monitored during session;Premedicated before session;Repositioned           PT Goals (current goals can now be found in the care plan section) Acute Rehab PT Goals Patient Stated Goal: none stated Progress towards PT goals: Progressing toward goals    Frequency    7X/week      PT Plan Current plan remains appropriate       AM-PAC PT "6 Clicks" Mobility   Outcome Measure  Help needed turning from your back to your side while in a flat bed without using bedrails?: A Little Help needed moving from lying on your back to sitting on the side of a flat bed without using bedrails?: A Little Help needed moving to and from a bed to a chair (including a wheelchair)?: A Little Help needed standing up from a chair using your arms (e.g., wheelchair or bedside chair)?: A Little Help needed to walk in  hospital room?: A Little Help needed climbing 3-5 steps with a railing? : A Little 6 Click Score: 18    End of Session Equipment Utilized During Treatment: Gait belt Activity Tolerance: Patient tolerated treatment well;Patient limited by fatigue Patient  left: in bed;with call bell/phone within reach;with bed alarm set;with family/visitor present Nurse Communication: Mobility status PT Visit Diagnosis: Other abnormalities of gait and mobility (R26.89);Muscle weakness (generalized) (M62.81);History of falling (Z91.81);Difficulty in walking, not elsewhere classified (R26.2);Pain Pain - Right/Left: Left Pain - part of body: Hip     Time: 7062-3762 PT Time Calculation (min) (ACUTE ONLY): 12 min  Charges:  $Gait Training: 8-22 mins                     Jetta Lout PTA 03/23/21, 4:51 PM

## 2021-03-23 NOTE — Progress Notes (Signed)
PT Cancellation Note  Patient Details Name: Beretta Ginsberg MRN: [id number] DOB: [birth year]   Cancelled Treatment:     PT attempt. Pt asleep in long sitting with daughter laying in recliner at bedside. Daughter requested Thereasa Parkin not awake pt due to lack of sleep previous night. Did state she ambulated to BR earlier this morning. Will return later this date and continue to follow and progress as able per POC.    Rushie Chestnut 03/23/2021, 11:45 AM

## 2021-03-23 NOTE — Progress Notes (Signed)
[facility] Room 149 [facility]) Hospital Liaison RN note:  Visited with patient and daughter at bedside. Discussed DME needs if insurance is not approved and patient returns home with family. They request a hospital bed, OBT and BSC. Spoke with Deliliah, TOC to confirm that they are still awaiting appeal. [facility] Liaison will continue to follow for disposition.  Please call with any hospice related questions or concerns.  Thank you for the opportunity to participate in this patient's care.  Cyndra Numbers, RN [facility] Liaison [phone]

## 2021-03-23 NOTE — Progress Notes (Signed)
PROGRESS NOTE    Sara Conley  FGH:[id number] DOB: 07/02/1918 DOA: 03/07/2021 PCP: Marisue Ivan, MD    HPI was taken from Dr. Mikeal Hawthorne:  Sara Conley is a 87 y.o. female with medical history significant of hypertension, hyperlipidemia, dementia, who only speaks [place] brought in by her daughter after sustaining a mechanical fall at home and having pain on her left side.  She tripped low while using her walker in the restroom.  She fell on her left side.  There was severe pain in the area and she called out to her daughter.  Patient was seen in the ER and has left intertrochanteric fracture.  Orthopedics consulted but patient is on Plavix.  Recommendation is for admission and getting patient ready for surgery probably in about 3 days.  She did not hit her head.  Patient is frail but has been apparently doing well in her usual state of health.  Able to do her ADLs according to the daughter.  She was using the walker but can move around the house.  She otherwise has no other complaints.  No other areas of injury..  ED Course: Temperature is 98.6 initial blood pressure 207/66 with pulse 59 respirate of 16 oxygen sat 87% on room air.  Currently 100%.  White count is 11.1 hemoglobin 10.7 platelets of 189.  Chemistry mostly within normal except BUN 35 calcium 8.7.  Glucose of 115.  Viral screen including COVID-19 is negative urinalysis showed WBC 16 with rare bacteria and positive nitrite.  Head CT without contrast showed no acute findings.  Chest x-ray showed no significant findings.  X-ray of the left hip showed comminuted intertrochanteric left hip fracture.  Patient being admitted to the hospital for further evaluation and treatment.   Hospital course from Dr. Mayford Knife 5/5-5/10/22: Pt presented w/ left hip fracture secondary to fall at home. Pt is s/p left intramedullary nail on 03/12/21 as per ortho surg. PT/OT should see the pt today. Pt's daughter is hopeful that the pt will go to SNF after  d/c. Also, pt has UTI and is on IV rocephin   Hospital course from Dr. Sherryll Burger 5/11-5/16/2022: Waiting for insurance appeal.  It was originally declined for which I did peer to peer twice and still declined.  Now family has appealed.   5/11-PT, OT recommended SNF.  Waiting for insurance Auth 5/12 -insurance declined.  Patient was very sleepy likely from narcotic and benzos. 5/13 -waiting on insurance appeal.  PT and OT reeval still recommending SNF.  Patient awake and alert today 5/14 -insurance declined SNF.  Family appealed 5/15 -offered to consider hospice at home but family wants to wait for insurance appeal which is pending 5/16 -family appeal to insurance still pending.  If it gets declined again they would consider hospice at home 5/17: Family appealed results are still pending. 5/18; apparently decision went up to second-level that will take another 2 days for a definitive answer. 5/19: Still waiting for insurance decision.  Assessment & Plan:   Principal Problem:   left Hip fracture (HCC) Active Problems:   Anxiety and depression   Coronary artery disease involving native coronary artery of native heart without angina pectoris   Essential hypertension   Other insomnia   Pure hypercholesterolemia   Valvular heart disease   Acute lower UTI   Pressure injury of skin   Left intertrochanteric fracture: s/p mechanical fall.  Continue plavix. S/p intramedullary nail on 03/12/21.  Family is requesting no narcotic meds if possible  HTN: Blood  pressure soft will hold metoprolol.  As needed clonidine  Hyperkalemia: likely secondary to ARB use. Lokelma x 1.  Normalized  HLD: continue statin   E. Coli UTI: completed course of Abx. Urine cx is growing e. coli   Hx of CAD: continue Plavix and statin. Continue to hold ARB secondary to hyperkalemia  Dementia: continue w/ supportive care. Continue on seroquel qhs and doxepin  Thrombocytopenia: resolved   Macrocytic anemia: folate, B12  are WNL. H&H are stable   Leukocytosis: Resolved   DVT prophylaxis: lovenox  Code Status: DNR Family Communication: Discussed with daughter at bedside Disposition Plan: SNF  Level of care: Med-Surg   Status is: Inpatient  Remains inpatient appropriate because:Ongoing diagnostic testing needed not appropriate for outpatient work up, Unsafe d/c plan, IV treatments appropriate due to intensity of illness or inability to take PO and Inpatient level of care appropriate due to severity of illness  Currently waiting from insurance decision for rehab.   Dispo: The patient is from: Home              Anticipated d/c is to: SNF versus home with hospice              Patient currently is not medically stable to d/c.   Difficult to place patient: Patient's family has appealed with her insurance declining SNF.  Appeal status is pending, will take another couple of days.   Consultants:   Ortho surg  Procedures:  Hip surgery  Antimicrobials: None  Subjective: Patient was resting comfortably when seen today.  Daughter at bedside.  Still waiting for insurance appeal results.  Objective: Vitals:   03/23/21 0506 03/23/21 0841 03/23/21 0846 03/23/21 1210  BP: (!) 147/61 (!) 146/58 128/83 133/63  Pulse: 94 (!) 102 (!) 59 82  Resp: 14 17 17 17   Temp: 97.8 F (36.6 C) 97.8 F (36.6 C) (!) 97.5 F (36.4 C) 98.3 F (36.8 C)  TempSrc:      SpO2: 97% 93% 99% 98%  Weight:      Height:        Intake/Output Summary (Last 24 hours) at 03/23/2021 1500 Last data filed at 03/23/2021 1413 Gross per 24 hour  Intake 600 ml  Output --  Net 600 ml   Filed Weights   03/07/21 1717  Weight: 42.6 kg    Examination:  General.  Frail elderly lady, in no acute distress. Pulmonary.  Lungs clear bilaterally, normal respiratory effort. CV.  Regular rate and rhythm, no JVD, rub or murmur. Abdomen.  Soft, nontender, nondistended, BS positive. CNS.  Alert and oriented.  No focal neurologic  deficit. Extremities.  No edema, no cyanosis, pulses intact and symmetrical. Psychiatry.  Judgment and insight appears normal.  Data Reviewed: I have personally reviewed following labs and imaging studies  CBC: Recent Labs  Lab 03/19/21 0548  WBC 8.8  HGB 8.1*  HCT 25.2*  MCV 98.1  PLT 241   Basic Metabolic Panel: Recent Labs  Lab 03/19/21 0548 03/21/21 0613  NA 140  --   K 3.8  --   CL 107  --   CO2 26  --   GLUCOSE 92  --   BUN 32*  --   CREATININE 0.93 0.81  CALCIUM 8.1*  --    GFR: Estimated Creatinine Clearance: 23.8 mL/min (by C-G formula based on SCr of 0.81 mg/dL). Liver Function Tests: No results for input(s): AST, ALT, ALKPHOS, BILITOT, PROT, ALBUMIN in the last 168 hours. No results for input(s):  LIPASE, AMYLASE in the last 168 hours. No results for input(s): AMMONIA in the last 168 hours. Coagulation Profile: No results for input(s): INR, PROTIME in the last 168 hours. Cardiac Enzymes: No results for input(s): CKTOTAL, CKMB, CKMBINDEX, TROPONINI in the last 168 hours. BNP (last 3 results) No results for input(s): PROBNP in the last 8760 hours. HbA1C: No results for input(s): HGBA1C in the last 72 hours. CBG: No results for input(s): GLUCAP in the last 168 hours. Lipid Profile: No results for input(s): CHOL, HDL, LDLCALC, TRIG, CHOLHDL, LDLDIRECT in the last 72 hours. Thyroid Function Tests: No results for input(s): TSH, T4TOTAL, FREET4, T3FREE, THYROIDAB in the last 72 hours. Anemia Panel: No results for input(s): VITAMINB12, FOLATE, FERRITIN, TIBC, IRON, RETICCTPCT in the last 72 hours. Sepsis Labs: No results for input(s): PROCALCITON, LATICACIDVEN in the last 168 hours.  No results found for this or any previous visit (from the past 240 hour(s)).    Radiology Studies: No results found.  Scheduled Meds: . atorvastatin  40 mg Oral QHS  . bisacodyl  10 mg Rectal Daily  . clopidogrel  75 mg Oral Daily  . docusate sodium  100 mg Oral BID  .  doxepin  10 mg Oral QHS  . polyethylene glycol  17 g Oral Daily  . QUEtiapine  25 mg Oral QHS   Continuous Infusions:    LOS: 16 days    Time spent: 15 mins   Arnetha Courser, MD [facility] Pager [phone]  If 7PM-7AM, please contact night-coverage 03/23/2021, 3:00 PM

## 2021-03-24 DIAGNOSIS — N3000 Acute cystitis without hematuria: Secondary | ICD-10-CM

## 2021-03-24 DIAGNOSIS — E7800 Pure hypercholesterolemia, unspecified: Secondary | ICD-10-CM

## 2021-03-24 DIAGNOSIS — F419 Anxiety disorder, unspecified: Secondary | ICD-10-CM | POA: Diagnosis not present

## 2021-03-24 DIAGNOSIS — S72142A Displaced intertrochanteric fracture of left femur, initial encounter for closed fracture: Principal | ICD-10-CM

## 2021-03-24 DIAGNOSIS — S72002A Fracture of unspecified part of neck of left femur, initial encounter for closed fracture: Secondary | ICD-10-CM | POA: Diagnosis not present

## 2021-03-24 DIAGNOSIS — I38 Endocarditis, valve unspecified: Secondary | ICD-10-CM

## 2021-03-24 DIAGNOSIS — W19XXXA Unspecified fall, initial encounter: Secondary | ICD-10-CM

## 2021-03-24 MED ORDER — HALOPERIDOL LACTATE 5 MG/ML IJ SOLN: 1.0000 mg | Freq: Once | INTRAMUSCULAR | Status: DC

## 2021-03-24 MED ORDER — POLYETHYLENE GLYCOL 3350 17 G PO PACK
17.0000 g | PACK | Freq: Every day | ORAL | 0 refills | Status: AC
Start: 2021-03-25 — End: ?

## 2021-03-24 MED ORDER — ALPRAZOLAM 0.25 MG PO TABS
0.2500 mg | ORAL_TABLET | Freq: Once | ORAL | Status: DC
  Filled 2021-03-24: qty 1

## 2021-03-24 MED ORDER — ACETAMINOPHEN 325 MG PO TABS
650.0000 mg | ORAL_TABLET | Freq: Four times a day (QID) | ORAL | 0 refills | Status: AC | PRN
Start: 2021-03-24 — End: ?

## 2021-03-24 MED ORDER — DOXEPIN HCL 10 MG PO CAPS: 10.0000 mg | ORAL_CAPSULE | Freq: Every day | ORAL | 1 refills | Status: AC

## 2021-03-24 MED ORDER — HALOPERIDOL LACTATE 5 MG/ML IJ SOLN
1.0000 mg | Freq: Once | INTRAMUSCULAR | Status: DC
  Filled 2021-03-24: qty 1

## 2021-03-24 MED ORDER — QUETIAPINE FUMARATE 25 MG PO TABS
25.0000 mg | ORAL_TABLET | Freq: Every day | ORAL | 1 refills | Status: AC
Start: 2021-03-24 — End: ?

## 2021-03-24 NOTE — TOC Transition Note (Signed)
Transition of Care [facility]) - CM/SW Discharge Note   Patient Details  Name: Sara Conley MRN: [id number] Date of Birth: 1918/01/30  Transition [facility] Care [facility]) CM/SW Contact:  Maud Deed, LCSW Phone Number: 03/24/2021, 3:05 PM   Clinical Narrative:    Pt medically stable for discharge per MD. Insurance appeal was once again denied. Pt's family has decided to take pt home with hospice care. CSW will arrange transport once nursing staff is ready.     Final next level of care: Home w Hospice Care Barriers to Discharge: No Barriers Identified   Patient Goals and CMS Choice   CMS Medicare.gov Compare Post Acute Care list provided to:: Legal Guardian Choice offered to / list presented to : Adult Children  Discharge Placement                  Name of family member notified: [place] Patient and family notified of of transfer: 03/24/21  Discharge Plan and Services                DME Arranged: Hospital bed,Bedside commode DME Agency: Other - Comment Photographer) Date DME Agency Contacted: 03/24/21 Time DME Agency Contacted: 1230 Representative spoke with at DME Agency: Kendal Hymen            Social Determinants of Health (SDOH) Interventions     Readmission Risk Interventions No flowsheet data found.

## 2021-03-24 NOTE — TOC Progression Note (Signed)
Transition of Care [facility]) - Progression Note    Patient Details  Name: Sara Conley MRN: [id number] Date of Birth: [birth year]  Transition of Care [facility]) CM/SW Contact  Liliana Cline, LCSW Phone Number: 03/24/2021, 11:55 AM  Clinical Narrative:   Tammy from [facility] [employer] called and said the appeal for SNF was denied and this was the second appeal. [employer] should have called member/family about their options for a third level appeal if they want to do one. Notified MD and assigned CSW.         Expected Discharge Plan and Services                                                 Social Determinants of Health (SDOH) Interventions    Readmission Risk Interventions No flowsheet data found.

## 2021-03-24 NOTE — Progress Notes (Signed)
Contacted daughter, Cristy Friedlander, for the patient is trying to get out of bed, is yelling very loud, very agitated and disruptive. The daughter explained the patient is not to have Haldol for she has previously had hallucinations when it was administered to her before. Asked daughter what has she had in the past to help make her more comfortable and the daughter states if she had tylenol and RN explained that she had it earlier this evening. Daughter further states that she has had Xanax before and to see if she can have that ordered in order to help her to be more calm. Informed Assigned Nurse Jaclynn Guarneri and Notified on-call Hospitalist - Steward Drone, NP of conversation with Daughter.  Asked daughter if she was willing to come and sit with her mother but the daughter states she is not able to at this time. Assigned Nurse and this Nurse/Float pool will continue to monitor patient to end of shift.

## 2021-03-24 NOTE — Discharge Summary (Signed)
Physician Discharge Summary  Sara Conley YNW:[id number] DOB: 01/19/1918 DOA: 03/07/2021  PCP: Marisue IvanLinthavong, Kanhka, MD  Admit date: 03/07/2021 Discharge date: 03/24/2021  Admitted From: Home Disposition: Home with hospice  Recommendations for Outpatient Follow-up:  1. Follow up with PCP in 1-2 weeks 2. Please obtain BMP/CBC in one week 3. Please follow up on the following pending results: Sara Conley  Home Health: Home hospice care Equipment/Devices: Home hospice package Discharge Condition: Stable CODE STATUS: DNR Diet recommendation: Heart Healthy / Carb Modified / Regular / Dysphagia   Brief/Interim Summary: Sara CritchleyMarianna Ferlitais a 87 y.o.femalewith medical history significant ofhypertension, hyperlipidemia, dementia, who only speaks [place] brought in by Sara daughter after sustaining a mechanical fall at home and having pain on Sara left side. She tripped low while using Sara walker in the restroom. She fell on Sara left side. There was severe pain in the area and she called out to Sara daughter. Patient was seen in the ER and has left intertrochanteric fracture. Orthopedics consulted but patient is on Plavix. Recommendation is for admission and getting patient ready for surgery probably in about 3 days. She did not hit Sara head. Patient is frail but has been apparently doing well in Sara usual state of health. Able to do Sara ADLs according to the daughter. She was using the walker but can move around the house. She was found to have left hip comminuted intertrochanteric fracture s/p left intramedullary nail placement on 03/12/2021 by orthopedic surgery.  Our physical therapist recommended SNF placement but Sara insurance declined despite having peer to peer review and multiple family appeals.  It was also declined by a third-party.  Patient currently in stable condition and to qualify for hospice care at home because of Sara advanced age and other comorbidities.  Family decided to take advantage  of home hospice services and she is being discharged home with hospice.  She was also found to have E. coli UTI during current hospitalization and completed the course of antibiotics.  Patient will continue current level of care and follow-up with Sara providers if needed.  Discharge Diagnoses:  Principal Problem:   left Hip fracture (HCC) Active Problems:   Anxiety and depression   Coronary artery disease involving native coronary artery of native heart without angina pectoris   Essential hypertension   Other insomnia   Pure hypercholesterolemia   Valvular heart disease   Acute lower UTI   Pressure injury of skin   Acute cystitis without hematuria   Closed displaced intertrochanteric fracture of left femur [facility](HCC)   Fall   Discharge Instructions  Discharge Instructions    Diet - low sodium heart healthy   Complete by: As directed    Increase activity slowly   Complete by: As directed    No dressing needed   Complete by: As directed      Allergies as of 03/24/2021   No Known Allergies     Medication List    STOP taking these medications   Doxepin HCl 6 MG Tabs Replaced by: doxepin 10 MG capsule   oxyCODONE 5 MG immediate release tablet Commonly known as: Roxicodone     TAKE these medications   acetaminophen 325 MG tablet Commonly known as: TYLENOL Take 2 tablets (650 mg total) by mouth every 6 (six) hours as needed for mild pain (or Fever >/= 101).   atorvastatin 40 MG tablet Commonly known as: LIPITOR Take 40 mg by mouth at bedtime.   cholecalciferol 25 MCG (1000 UNIT) tablet Commonly known as:  VITAMIN D3 Take 1,000 Units by mouth daily.   clopidogrel 75 MG tablet Commonly known as: PLAVIX Take 75 mg by mouth daily.   doxepin 10 MG capsule Commonly known as: SINEQUAN Take 1 capsule (10 mg total) by mouth at bedtime. Replaces: Doxepin HCl 6 MG Tabs   escitalopram 10 MG tablet Commonly known as: LEXAPRO Take 10 mg by mouth at bedtime.    HYDROcodone-acetaminophen 5-325 MG tablet Commonly known as: NORCO/VICODIN Take 1 tablet by mouth every 6 (six) hours as needed for moderate pain.   irbesartan 300 MG tablet Commonly known as: AVAPRO Take 300 mg by mouth daily.   melatonin 3 MG Tabs tablet Take 3 mg by mouth at bedtime.   metoprolol succinate 50 MG 24 hr tablet Commonly known as: TOPROL-XL Take 50 mg by mouth daily.   polyethylene glycol 17 g packet Commonly known as: MIRALAX / GLYCOLAX Take 17 g by mouth daily. Start taking on: Mar 25, 2021   QUEtiapine 25 MG tablet Commonly known as: SEROQUEL Take 1 tablet (25 mg total) by mouth at bedtime.            Discharge Care Instructions  (From admission, onward)         Start     Ordered   03/24/21 0000  No dressing needed        03/24/21 1300          Follow-up Information    Marisue Ivan, MD. Schedule an appointment as soon as possible for a visit.   Specialty: Family Medicine Contact information: [address] [facility] [place] [address] [phone]              No Known Allergies  Consultations:  Orthopedic surgery  Procedures/Studies: CT Head Wo Contrast  Result Date: 03/07/2021 CLINICAL DATA:  Larey Seat EXAM: CT HEAD WITHOUT CONTRAST TECHNIQUE: Contiguous axial images were obtained from the base of the skull through the vertex without intravenous contrast. COMPARISON:  12/11/2020 FINDINGS: Brain: Scattered hypodensities throughout the periventricular and subcortical white matter are consistent with chronic small vessel ischemic changes, stable. No acute infarct or hemorrhage. Lateral ventricles and midline structures are stable. No acute extra-axial fluid collections. No mass effect. Vascular: No hyperdense vessel or unexpected calcification. Skull: Normal. Negative for fracture or focal lesion. Sinuses/Orbits: No acute finding. Other: Sara Conley. IMPRESSION: 1. Stable exam, no acute intracranial process.  Electronically Signed   By: Sharlet Salina M.D.   On: 03/07/2021 18:39   DG Chest Portable 1 View  Result Date: 03/07/2021 CLINICAL DATA:  Tripped and fell, left hip pain EXAM: PORTABLE CHEST 1 VIEW COMPARISON:  Sara Conley. FINDINGS: Single frontal view of the chest demonstrates an unremarkable cardiac silhouette. Atherosclerosis of the aortic arch. No airspace disease, effusion, or pneumothorax. No acute displaced fractures. The bones are diffusely osteopenic. IMPRESSION: 1. No acute intrathoracic process. Electronically Signed   By: Sharlet Salina M.D.   On: 03/07/2021 18:53   DG HIP OPERATIVE UNILAT W OR W/O PELVIS LEFT  Result Date: 03/12/2021 CLINICAL DATA:  Fracture fixation. EXAM: OPERATIVE LEFT HIP (WITH PELVIS IF PERFORMED) TECHNIQUE: Fluoroscopic spot image(s) were submitted for interpretation post-operatively. COMPARISON:  Preoperative radiograph 03/07/2021 FINDINGS: Three fluoroscopic spot views of the left hip and femur obtained in the operating room. Intramedullary nail with trans trochanteric screw fixation traversing intertrochanteric femur fracture. Fluoroscopy time 22 seconds. IMPRESSION: Procedural fluoroscopy during intertrochanteric femur fracture fixation. Electronically Signed   By: Narda Rutherford M.D.   On: 03/12/2021 18:21  DG HIP UNILAT WITH PELVIS 2-3 VIEWS LEFT  Result Date: 03/07/2021 CLINICAL DATA:  Tripped and fell, left hip pain EXAM: DG HIP (WITH OR WITHOUT PELVIS) 2-3V LEFT COMPARISON:  Sara Conley. FINDINGS: Frontal view of the pelvis as well as frontal and cross-table lateral views of the left hip are obtained. There is a comminuted intertrochanteric left hip fracture with mild impaction and varus angulation at the fracture site. No dislocation. The remainder of the bony pelvis is unremarkable. Right hip arthroplasty is identified without complication. IMPRESSION: 1. Comminuted intertrochanteric left hip fracture as above. Electronically Signed   By: Sharlet Salina M.D.   On:  03/07/2021 18:54     Subjective: Patient was seen and examined today.  Lying comfortably in bed.  Denies any complaints.  Daughter at bedside.  Daughter was becoming very frustrated because of insurance situation and would like to take Sara home today with hospice help.  Discharge Exam: Vitals:   03/24/21 0827 03/24/21 1240  BP: (!) 144/58 (!) 154/68  Pulse: 87 97  Resp: 14 14  Temp: 98 F (36.7 C) 98.6 F (37 C)  SpO2: 99% 99%   Vitals:   03/23/21 2048 03/23/21 2328 03/24/21 0827 03/24/21 1240  BP: (!) 163/67 (!) 163/74 (!) 144/58 (!) 154/68  Pulse: 92 94 87 97  Resp: 17 16 14 14   Temp: 97.7 F (36.5 C) 98.1 F (36.7 C) 98 F (36.7 C) 98.6 F (37 C)  TempSrc:      SpO2: 99% 99% 99% 99%  Weight:      Height:        General: Pt is alert, awake, not in acute distress Cardiovascular: RRR, S1/S2 +, no rubs, no gallops Respiratory: CTA bilaterally, no wheezing, no rhonchi Abdominal: Soft, NT, ND, bowel sounds + Extremities: no edema, no cyanosis   The results of significant diagnostics from this hospitalization (including imaging, microbiology, ancillary and laboratory) are listed below for reference.    Microbiology: No results found for this or any previous visit (from the past 240 hour(s)).   Labs: BNP (last 3 results) No results for input(s): BNP in the last 8760 hours. Basic Metabolic Panel: Recent Labs  Lab 03/19/21 0548 03/21/21 0613  NA 140  --   K 3.8  --   CL 107  --   CO2 26  --   GLUCOSE 92  --   BUN 32*  --   CREATININE 0.93 0.81  CALCIUM 8.1*  --    Liver Function Tests: No results for input(s): AST, ALT, ALKPHOS, BILITOT, PROT, ALBUMIN in the last 168 hours. No results for input(s): LIPASE, AMYLASE in the last 168 hours. No results for input(s): AMMONIA in the last 168 hours. CBC: Recent Labs  Lab 03/19/21 0548  WBC 8.8  HGB 8.1*  HCT 25.2*  MCV 98.1  PLT 241   Cardiac Enzymes: No results for input(s): CKTOTAL, CKMB, CKMBINDEX,  TROPONINI in the last 168 hours. BNP: Invalid input(s): POCBNP CBG: No results for input(s): GLUCAP in the last 168 hours. D-Dimer No results for input(s): DDIMER in the last 72 hours. Hgb A1c No results for input(s): HGBA1C in the last 72 hours. Lipid Profile No results for input(s): CHOL, HDL, LDLCALC, TRIG, CHOLHDL, LDLDIRECT in the last 72 hours. Thyroid function studies No results for input(s): TSH, T4TOTAL, T3FREE, THYROIDAB in the last 72 hours.  Invalid input(s): FREET3 Anemia work up No results for input(s): VITAMINB12, FOLATE, FERRITIN, TIBC, IRON, RETICCTPCT in the last 72 hours. Urinalysis  Component Value Date/Time   COLORURINE YELLOW (A) 03/07/2021 1859   APPEARANCEUR HAZY (A) 03/07/2021 1859   LABSPEC 1.020 03/07/2021 1859   PHURINE 5.0 03/07/2021 1859   GLUCOSEU NEGATIVE 03/07/2021 1859   HGBUR NEGATIVE 03/07/2021 1859   BILIRUBINUR NEGATIVE 03/07/2021 1859   KETONESUR NEGATIVE 03/07/2021 1859   PROTEINUR 30 (A) 03/07/2021 1859   NITRITE POSITIVE (A) 03/07/2021 1859   LEUKOCYTESUR TRACE (A) 03/07/2021 1859   Sepsis Labs Invalid input(s): PROCALCITONIN,  WBC,  LACTICIDVEN Microbiology No results found for this or any previous visit (from the past 240 hour(s)).  Time coordinating discharge: Over 30 minutes  SIGNED:  Arnetha Courser, MD  [facility] 03/24/2021, 1:02 PM  If 7PM-7AM, please contact night-coverage [email or web address]  This record has been created using Conservation officer, historic buildings. Errors have been sought and corrected,but may not always be located. Such creation errors do not reflect on the standard of care.

## 2021-03-24 NOTE — Progress Notes (Signed)
Physical Therapy Treatment Patient Details Name: Sara Conley MRN: [id number] DOB: 1918/08/18 Today's Date: 03/24/2021    History of Present Illness Pt is a 87 y.o. female presenting to hospital 5/4 s/p mechanical fall (tripped while using walker).  Imaging showing comminuted intertrochanteric L hip fx.  Likely UTI.  Pt s/p L IMN femoral for L hip fx 03/12/21.  PMH includes mild dementia, htn, HLD, coronary angioplasty with stent placement.    PT Comments    Pt was sitting EOB with daughter at bedside upon arriving. She agrees to PT session. Stood from lowest bed height with min assist. Ambulated 70 ft with mostly CGA however required min assist with navigating tight spaces and making turns. Overall tolerated well but continues to have antalgic step to gait pattern with narrow BOS. Daughter is eagerly awaiting insurance decision. PT recommends SNF. If unable to DC to SNF, plan is for DC home with hospice/palliative support. Equipment recs are for if pt DCs home.    Follow Up Recommendations  SNF     Equipment Recommendations  Rolling walker with 5" wheels;3in1 (PT);Wheelchair (measurements PT);Wheelchair cushion (measurements PT);Hospital bed;Other (comment) (Equipment recs are for if pt DCs straight home)       Precautions / Restrictions Precautions Precautions: Fall Restrictions Weight Bearing Restrictions: Yes LLE Weight Bearing: Weight bearing as tolerated    Mobility  Bed Mobility      General bed mobility comments: Pt was sitting EOB upon arriving    Transfers Overall transfer level: Needs assistance Equipment used: Rolling walker (2 wheeled) Transfers: Sit to/from Stand Sit to Stand: Min assist         General transfer comment: Min assist to stand  from lowest bed height. Needs tacle cues for handplacement and to slide forward prior to standing  Ambulation/Gait Ambulation/Gait assistance: Min guard;Min assist Gait Distance (Feet): 70 Feet Assistive device:  Rolling walker (2 wheeled) Gait Pattern/deviations: Trunk flexed;Step-to pattern;Narrow base of support;Antalgic Gait velocity: decreased   General Gait Details: Pt ambulated ~ 70 ft with RW + CGA/Min assist. Flexed posture with antalgic step to pattern. slow cadance but steady. Min assist with turning and navigation of RW around obsticles       Balance Overall balance assessment: Needs assistance Sitting-balance support: Feet supported;Bilateral upper extremity supported Sitting balance-Leahy Scale: Good Sitting balance - Comments: no balance deficits in sitting   Standing balance support: Bilateral upper extremity supported;During functional activity Standing balance-Leahy Scale: Fair Standing balance comment: reliant on BUE support during standing activity       Cognition Arousal/Alertness: Awake/alert Behavior During Therapy: WFL for tasks assessed/performed Overall Cognitive Status: History of cognitive impairments - at baseline Area of Impairment: Orientation;Attention;Memory;Following commands;Safety/judgement;Problem solving      Following Commands: Follows one step commands consistently;Follows one step commands with increased time Safety/Judgement: Decreased awareness of safety;Decreased awareness of deficits   Problem Solving: Slow processing           General Comments General comments (skin integrity, edema, etc.): Discussed with daughter POC and insurance still awaiting auth. Daughter eager to get answer and is prepared to DC home with hospice      Pertinent Vitals/Pain Pain Assessment: Faces Faces Pain Scale: Hurts little more Pain Location: L hip/thigh Pain Descriptors / Indicators: Guarding;Operative site guarding;Grimacing Pain Intervention(s): Limited activity within patient's tolerance;Monitored during session;Premedicated before session;Repositioned           PT Goals (current goals can now be found in the care plan section) Acute Rehab PT  Goals Patient  Stated Goal: none stated Progress towards PT goals: Progressing toward goals    Frequency    7X/week      PT Plan Current plan remains appropriate       AM-PAC PT "6 Clicks" Mobility   Outcome Measure  Help needed turning from your back to your side while in a flat bed without using bedrails?: A Little Help needed moving from lying on your back to sitting on the side of a flat bed without using bedrails?: A Little Help needed moving to and from a bed to a chair (including a wheelchair)?: A Little Help needed standing up from a chair using your arms (e.g., wheelchair or bedside chair)?: A Little Help needed to walk in hospital room?: A Little Help needed climbing 3-5 steps with a railing? : A Little 6 Click Score: 18    End of Session Equipment Utilized During Treatment: Other (comment) (pt does not like use of gait belt) Activity Tolerance: Patient tolerated treatment well;Patient limited by fatigue;Patient limited by pain Patient left: in chair;with call bell/phone within reach;with chair alarm set;with family/visitor present Nurse Communication: Mobility status PT Visit Diagnosis: Other abnormalities of gait and mobility (R26.89);Muscle weakness (generalized) (M62.81);History of falling (Z91.81);Difficulty in walking, not elsewhere classified (R26.2);Pain Pain - Right/Left: Left Pain - part of body: Hip     Time: 4315-4008 PT Time Calculation (min) (ACUTE ONLY): 24 min  Charges:  $Gait Training: 8-22 mins $Therapeutic Activity: 8-22 mins                     Jetta Lout PTA 03/24/21, 8:52 AM

## 2021-04-03 DIAGNOSIS — I10 Essential (primary) hypertension: Secondary | ICD-10-CM | POA: Diagnosis not present

## 2021-04-03 DIAGNOSIS — F419 Anxiety disorder, unspecified: Secondary | ICD-10-CM | POA: Diagnosis not present

## 2021-04-03 DIAGNOSIS — I2510 Atherosclerotic heart disease of native coronary artery without angina pectoris: Secondary | ICD-10-CM | POA: Diagnosis not present

## 2021-04-03 DIAGNOSIS — G4700 Insomnia, unspecified: Secondary | ICD-10-CM | POA: Diagnosis not present

## 2021-04-03 DIAGNOSIS — R634 Abnormal weight loss: Secondary | ICD-10-CM | POA: Diagnosis not present

## 2021-04-03 DIAGNOSIS — F32A Depression, unspecified: Secondary | ICD-10-CM | POA: Diagnosis not present

## 2021-04-03 DIAGNOSIS — E785 Hyperlipidemia, unspecified: Secondary | ICD-10-CM | POA: Diagnosis not present

## 2021-04-16 DIAGNOSIS — R634 Abnormal weight loss: Secondary | ICD-10-CM | POA: Diagnosis not present

## 2021-04-16 DIAGNOSIS — G4700 Insomnia, unspecified: Secondary | ICD-10-CM | POA: Diagnosis not present

## 2021-04-16 DIAGNOSIS — E785 Hyperlipidemia, unspecified: Secondary | ICD-10-CM | POA: Diagnosis not present

## 2021-04-16 DIAGNOSIS — F32A Depression, unspecified: Secondary | ICD-10-CM | POA: Diagnosis not present

## 2021-04-16 DIAGNOSIS — I10 Essential (primary) hypertension: Secondary | ICD-10-CM | POA: Diagnosis not present

## 2021-04-16 DIAGNOSIS — F419 Anxiety disorder, unspecified: Secondary | ICD-10-CM | POA: Diagnosis not present

## 2021-04-16 DIAGNOSIS — I2510 Atherosclerotic heart disease of native coronary artery without angina pectoris: Secondary | ICD-10-CM | POA: Diagnosis not present

## 2021-04-17 DIAGNOSIS — Z8781 Personal history of (healed) traumatic fracture: Secondary | ICD-10-CM | POA: Diagnosis not present

## 2021-04-17 DIAGNOSIS — M170 Bilateral primary osteoarthritis of knee: Secondary | ICD-10-CM | POA: Diagnosis not present

## 2021-06-04 DEATH — deceased

## 2022-05-17 IMAGING — RF DG SWALLOWING FUNCTION
13 of 23 series · 13 of 24 positions shown · non-contrast
Comparison: None

CLINICAL DATA: Dysphagia coughing after eating.

EXAM:
MODIFIED BARIUM SWALLOW
TECHNIQUE: Different consistencies of barium were administered orally to the
patient by the Speech Pathologist. Imaging of the pharynx was
performed in the lateral projection. The radiologist was present in
the fluoroscopy room for this study, providing personal supervision.
FLUOROSCOPY TIME:  Fluoroscopy Time:  3 minutes 6 seconds
Radiation Exposure Index (if provided by the fluoroscopic device):
11.5 mGy
Number of Acquired Spot Images: 0

[Series 1: run · 1 of 16 frames shown (1 of 13)]
[frame 3/16]
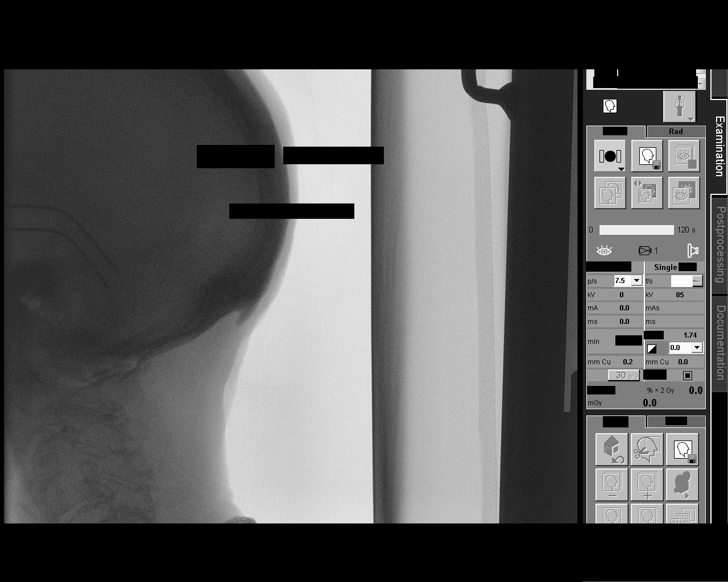

[Series 3: run · 1 of 275 frames shown (2 of 13)]
[frame 37/275]
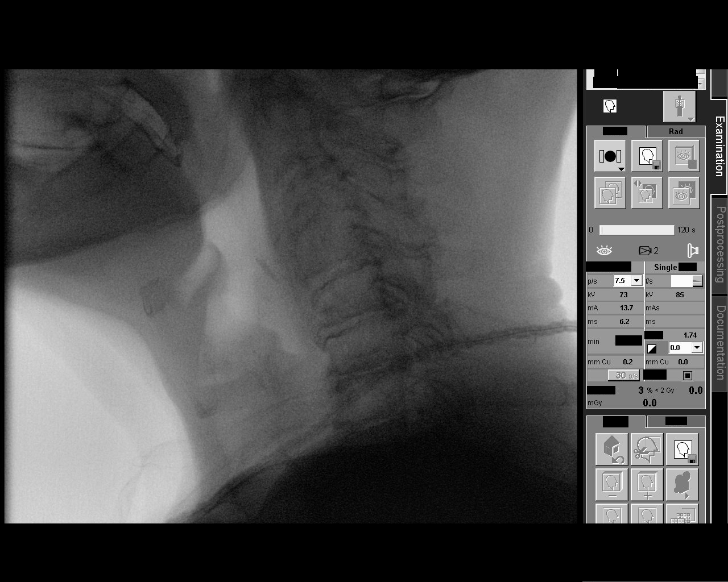

[Series 5: run · 1 of 715 frames shown (3 of 13)]
[frame 53/715]
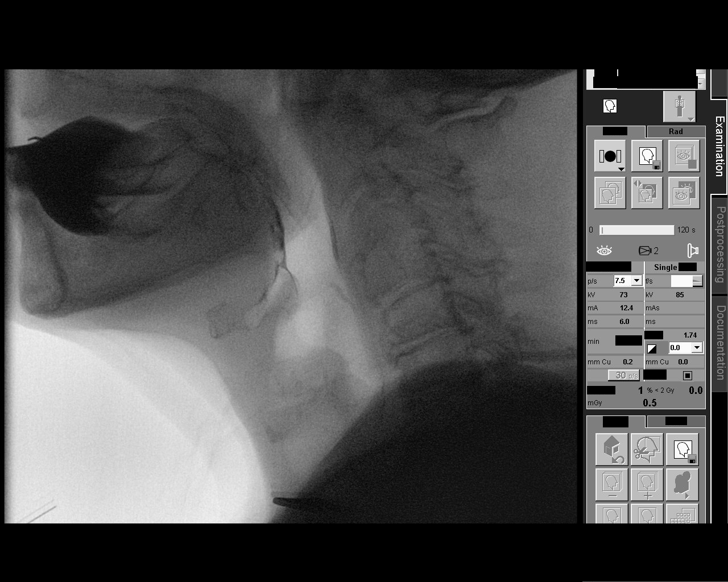

[Series 6: run · 1 of 82 frames shown (4 of 13)]
[frame 70/82]
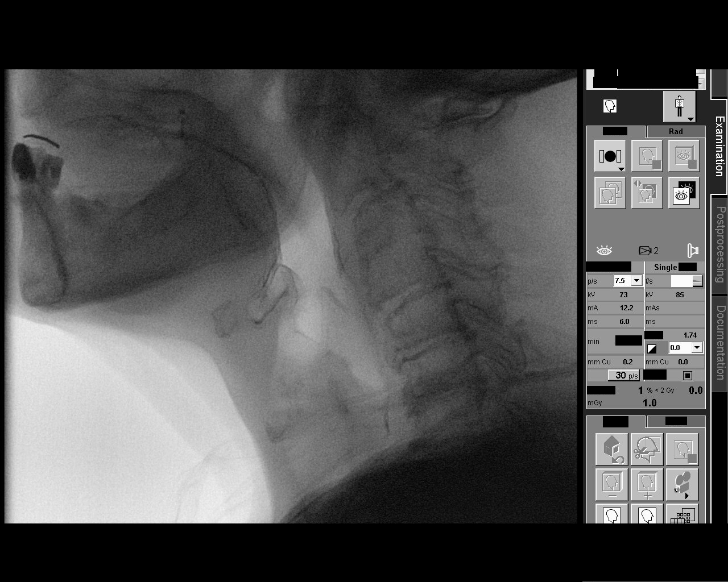

[Series 8: run · 1 of 214 frames shown (5 of 13)]
[frame 182/214]
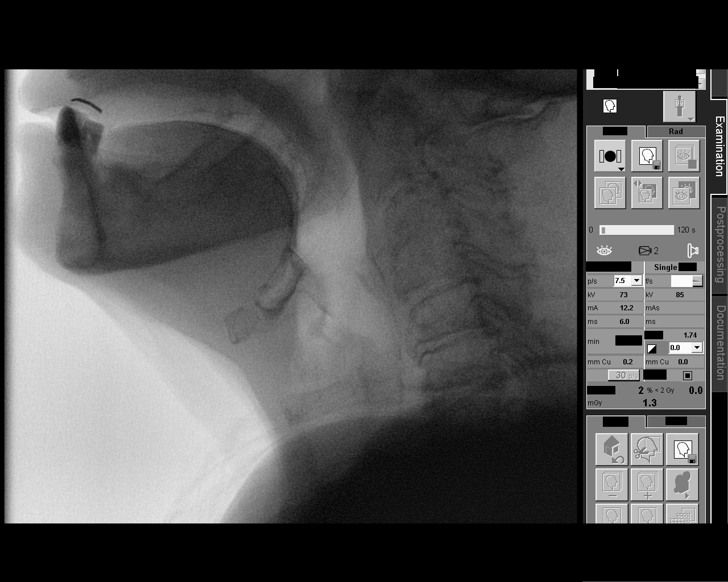

[Series 10: run · 1 of 82 frames shown (6 of 13)]
[frame 70/82]
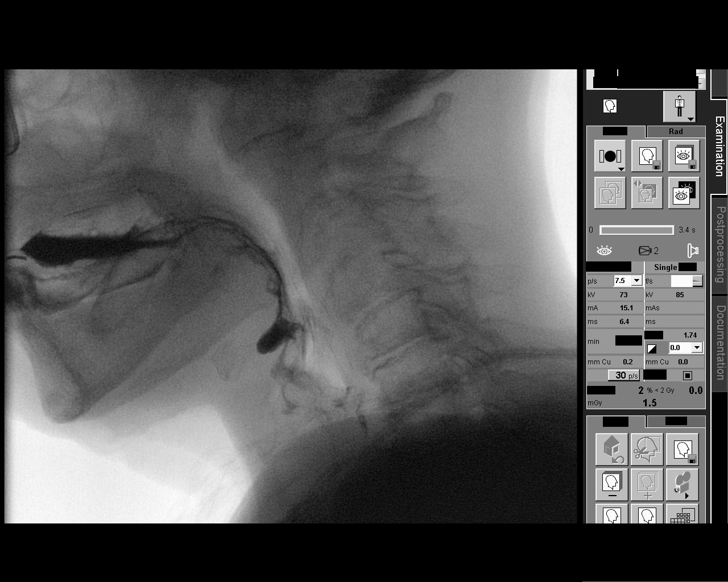

[Series 12: run · 1 of 625 frames shown (7 of 13)]
[frame 532/625]
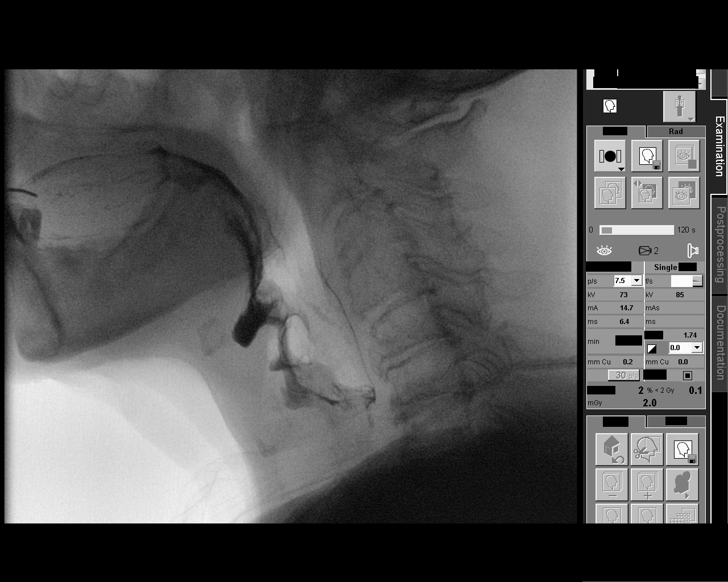

[Series 13: run · 1 of 197 frames shown (8 of 13)]
[frame 169/197]
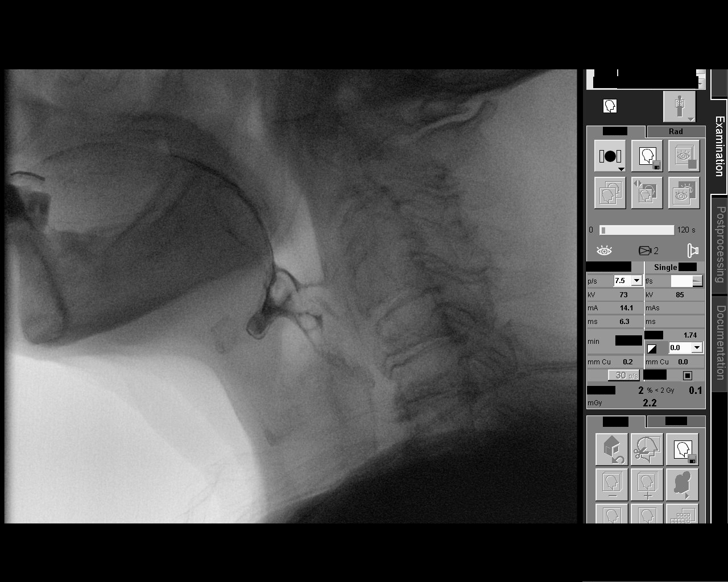

[Series 15: run · 1 of 276 frames shown (9 of 13)]
[frame 235/276]
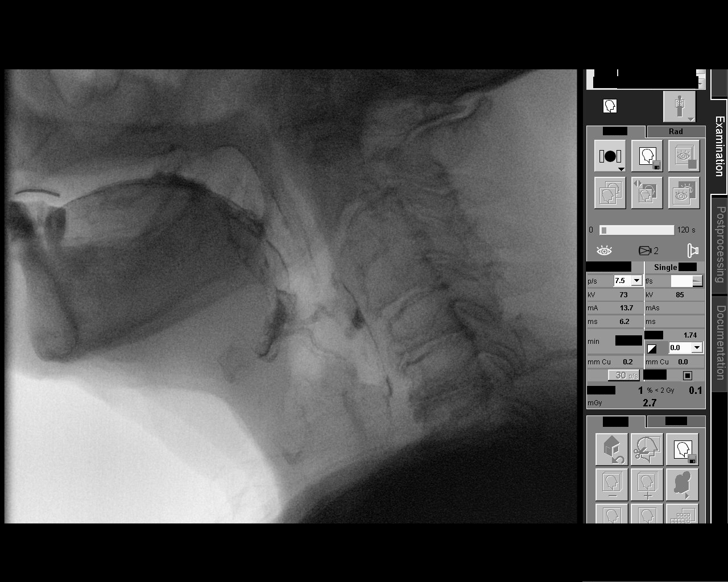

[Series 17: run · 1 of 269 frames shown (10 of 13)]
[frame 229/269]
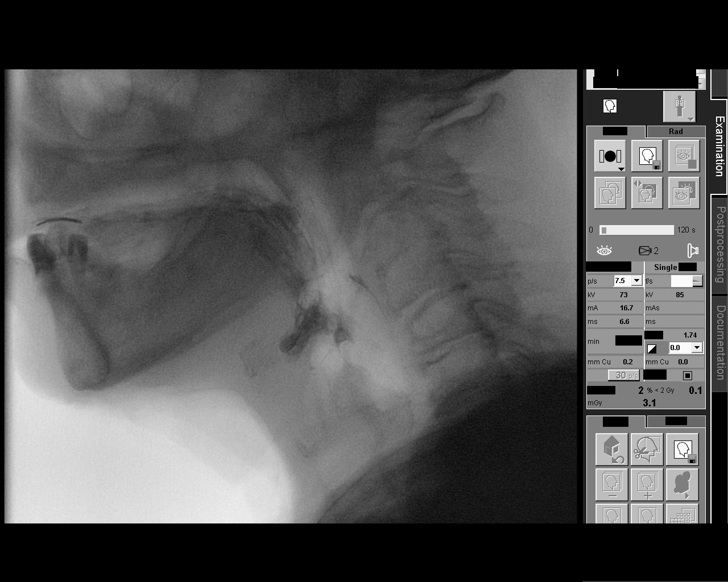

[Series 19: run · 1 of 416 frames shown (11 of 13)]
[frame 209/416]
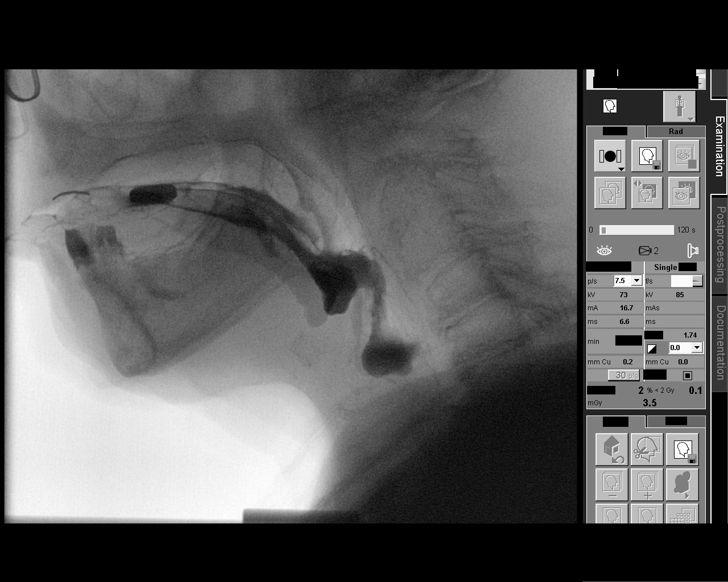

[Series 21: run · 1 of 164 frames shown (12 of 13)]
[frame 97/164]
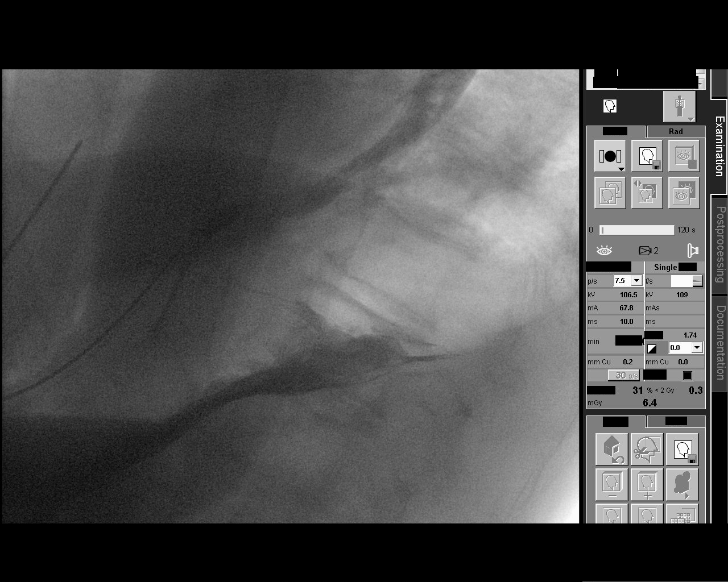

[Series 23: run · 1 of 156 frames shown (13 of 13)]
[frame 154/156]
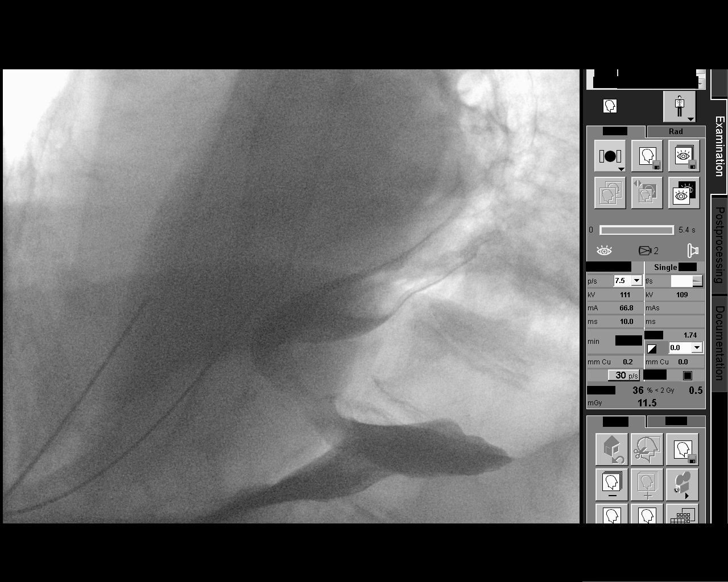

[13 of 24 positions shown; findings below may reference images not displayed]

FINDINGS: Lateral view showing largely edentulous appearance of the oral
cavity. Cervical spinal degenerative changes.

Swallowing performed across multiple consistencies ranging from thin
barium to barium tablet. No signs of penetration or aspiration with
normal swallow function.
IMPRESSION: No signs of penetration or aspiration.

Please refer to the Speech Pathologists report for complete details
and recommendations.
